# Patient Record
Sex: Female | Born: 1945 | Race: White | Hispanic: No | Marital: Married | Smoking: Former smoker
Health system: Southern US, Community
[De-identification: ages and names within clinical notes are randomized; demographics above are authoritative.]

## PROBLEM LIST (undated history)

## (undated) DIAGNOSIS — M1990 Unspecified osteoarthritis, unspecified site: Secondary | ICD-10-CM

## (undated) DIAGNOSIS — E7800 Pure hypercholesterolemia, unspecified: Secondary | ICD-10-CM

## (undated) DIAGNOSIS — Z5189 Encounter for other specified aftercare: Secondary | ICD-10-CM

## (undated) DIAGNOSIS — Z9889 Other specified postprocedural states: Secondary | ICD-10-CM

## (undated) DIAGNOSIS — J302 Other seasonal allergic rhinitis: Secondary | ICD-10-CM

## (undated) DIAGNOSIS — R112 Nausea with vomiting, unspecified: Secondary | ICD-10-CM

## (undated) HISTORY — PX: MOUTH SURGERY: SHX715

## (undated) HISTORY — PX: APPENDECTOMY: SHX54

## (undated) HISTORY — PX: TONSILLECTOMY: SUR1361

---
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
## 1991-12-21 HISTORY — PX: LAPAROTOMY: SHX154

## 1998-12-03 ENCOUNTER — Other Ambulatory Visit: Admission: RE | Admit: 1998-12-03 | Discharge: 1998-12-03 | Payer: Self-pay | Admitting: Gynecology

## 2000-01-26 ENCOUNTER — Other Ambulatory Visit: Admission: RE | Admit: 2000-01-26 | Discharge: 2000-01-26 | Payer: Self-pay | Admitting: Gynecology

## 2000-02-09 ENCOUNTER — Encounter (INDEPENDENT_AMBULATORY_CARE_PROVIDER_SITE_OTHER): Payer: Self-pay | Admitting: Specialist

## 2000-02-09 ENCOUNTER — Other Ambulatory Visit: Admission: RE | Admit: 2000-02-09 | Discharge: 2000-02-09 | Payer: Self-pay | Admitting: Gynecology

## 2001-12-05 ENCOUNTER — Other Ambulatory Visit: Admission: RE | Admit: 2001-12-05 | Discharge: 2001-12-05 | Payer: Self-pay | Admitting: Gynecology

## 2002-08-24 ENCOUNTER — Encounter: Payer: Self-pay | Admitting: *Deleted

## 2002-08-24 ENCOUNTER — Encounter: Admission: RE | Admit: 2002-08-24 | Discharge: 2002-08-24 | Payer: Self-pay | Admitting: *Deleted

## 2002-09-19 ENCOUNTER — Ambulatory Visit (HOSPITAL_COMMUNITY): Admission: RE | Admit: 2002-09-19 | Discharge: 2002-09-19 | Payer: Self-pay | Admitting: Gastroenterology

## 2002-09-19 ENCOUNTER — Encounter (INDEPENDENT_AMBULATORY_CARE_PROVIDER_SITE_OTHER): Payer: Self-pay | Admitting: Specialist

## 2003-01-24 ENCOUNTER — Other Ambulatory Visit: Admission: RE | Admit: 2003-01-24 | Discharge: 2003-01-24 | Payer: Self-pay | Admitting: Gynecology

## 2004-02-10 ENCOUNTER — Other Ambulatory Visit: Admission: RE | Admit: 2004-02-10 | Discharge: 2004-02-10 | Payer: Self-pay | Admitting: Gynecology

## 2005-03-04 ENCOUNTER — Other Ambulatory Visit: Admission: RE | Admit: 2005-03-04 | Discharge: 2005-03-04 | Payer: Self-pay | Admitting: Gynecology

## 2006-04-20 ENCOUNTER — Other Ambulatory Visit: Admission: RE | Admit: 2006-04-20 | Discharge: 2006-04-20 | Payer: Self-pay | Admitting: Gynecology

## 2007-05-09 ENCOUNTER — Other Ambulatory Visit: Admission: RE | Admit: 2007-05-09 | Discharge: 2007-05-09 | Payer: Self-pay | Admitting: Gynecology

## 2007-05-14 ENCOUNTER — Encounter: Admission: RE | Admit: 2007-05-14 | Discharge: 2007-05-14 | Payer: Self-pay | Admitting: Internal Medicine

## 2008-06-19 ENCOUNTER — Other Ambulatory Visit: Admission: RE | Admit: 2008-06-19 | Discharge: 2008-06-19 | Payer: Self-pay | Admitting: Gynecology

## 2011-05-07 NOTE — Op Note (Signed)
   [name], Leslie Rich                     ACCOUNT NO.:  [id number]   MEDICAL RECORD NO.:  [id number]                   PATIENT TYPE:  AMB   LOCATION:  ENDO                                 FACILITY:  [facility]   PHYSICIAN:  Florencia Reasons, M.D.             DATE OF BIRTH:  1946-04-05   DATE OF PROCEDURE:  09/19/2002  DATE OF DISCHARGE:                                 OPERATIVE REPORT   PROCEDURE:  Colonoscopy.   INDICATIONS FOR PROCEDURE:  This 65 year old female with transient Hemoccult  positive stool and unimpressive upper endoscopy.   FINDINGS:  Normal colonoscopy.   PROCEDURE:  The nature, purpose, and risks of the procedure have been  discussed with the patient, who provided written consent.   SEDATION:  For this procedure and the upper endoscopy which proceeded it  totalled Fentanyl 100 mcg and Versed 10 mg without arrhythmias or  desaturation.   PROCEDURE:  The Olympus adjustable tension pediatric video colonoscope was  advanced with some looping to the cecum, as identified by clear  visualization of the appendiceal orifice.  The quality of the prep was  excellent and it was felt that all areas were well seen.  There was quite a  bit of looping during advancement of the scope, suggesting that use of an  adult colonoscope might be more appropriate on future exams.   This was a normal examination.  No polyps, cancer, colitis, vascular  malformations, or diverticulosis were noted.  There might have been some  prediverticular change in the sigmoid region.  Retroflexion in the rectum as  well as reinspection of the rectum and distal sigmoid was unremarkable.  There was a fair amount of muscular thickening in the sigmoid region.   No biopsies were obtained.  The patient tolerated the procedure well and  there were no apparent complications.    IMPRESSION:  Normal colonoscopy.  No source of heme-positivity identified.   PLAN:  Flexible sigmoidoscopy in five years with  consideration for repeat  colonoscopy in 10 years.                                                Florencia Reasons, M.D.    RVB/MEDQ  D:  09/19/2002  T:  09/19/2002  Job:  [id number]   cc:   Marcene Duos, M.D.  [address]  [place]  [place] [address]  Fax: [phone]

## 2011-05-07 NOTE — Op Note (Signed)
Leslie Rich, Leslie Rich                     ACCOUNT NO.:  [id number]   MEDICAL RECORD NO.:  [id number]                   PATIENT TYPE:  AMB   LOCATION:  ENDO                                 FACILITY:  [facility]   PHYSICIAN:  Florencia Reasons, M.D.             DATE OF BIRTH:  1946/05/08   DATE OF PROCEDURE:  09/19/2002  DATE OF DISCHARGE:                                 OPERATIVE REPORT   PROCEDURE:  Upper endoscopy with biopsy.   INDICATION:  A 65 year old female with hemoccult positive stool while on  aspirin products.  She has no significant GI tract symptoms other than  occasional diet-related heartburn.  She was hemoccult negative when I  checked her in the office.   FINDINGS:  Small islands of Barrett's-appearing mucosa in the distal  esophagus.  Minimal antral gastritis.   DESCRIPTION OF PROCEDURE:  The nature, purpose, and risks of the procedure  have been discussed with the patient, who provided written consent.  Sedation for this procedure was fentanyl 25 mcg and Versed 4 mg IV without  arrhythmias or desaturation.  The Olympus video endoscope was passed under  direct vision.  The vocal cords looked normal.  The esophagus was readily  entered.  The distal esophagus had some small (2 x 5mm) patches of Barrett's-  appearing mucosa without any reflux esophagitis, free reflux, rings,  strictures or hiatal hernia appreciated.  A couple of biopsies were obtained  from the distal esophagus prior to removal of the scope.  There was no  evidence of varices, infection or neoplasia.   The stomach contained a small clear residual, which was suctioned up.  In  the antrum, were a couple of focal areas of mucosal erythema consistent with  aspirin-induced gastropathy of minimal severity.  No erosive changes or  frank mucosal hemorrhages were noted.  No polyps, masses or ulcers were seen  and the retroflexed view of the proximal stomach was unremarkable.  The  pylorus, duodenal bulb,  and second duodenum also looked normal.   The patient tolerated the procedure well and there were no apparent  complications.    IMPRESSION:  Small islands of Barrett's-appearing mucosa in the distal  esophagus of doubtful clinical significance.  Minimal antral gastritis,  probably from aspirin exposure, which, in the more severe state, might  conceivably have come from the patient's transiently hemoccult positive  stool.   PLAN:  Proceed to colonoscopic evaluation.                                                  Florencia Reasons, M.D.    RVB/MEDQ  D:  09/19/2002  T:  09/19/2002  Job:  [id number]   cc:   Marcene Duos, M.D.  [address]  [place]  [place] [address]  Fax: [phone]

## 2011-11-02 NOTE — Patient Instructions (Addendum)
   Your procedure is scheduled on: Wednesday November 28th  Enter through the [employer] [facility] at:6am Pick up the phone at the desk and dial [phone] and inform us of your arrival.  Please call this number if you have any problems the morning of surgery: [phone]  Remember: Do not eat food after midnight:Tuesday Do not drink clear liquids after:Tuesday Take these medicines the morning of surgery with a SIP OF WATER:none  Do not wear jewelry, make-up, or FINGER nail polish Do not wear lotions, powders, or perfumes.  You may not  wear deodorant. Do not shave 48 hours prior to surgery. Do not bring valuables to the hospital.  Leave suitcase in the car. After Surgery it may be brought to your room. For patients being admitted to the hospital, checkout time is 11:00am the day of discharge.      Remember to use your hibiclens as instructed.Please shower with 1/2 bottle the evening before your surgery and the other 1/2 bottle the morning of surgery.

## 2011-11-05 ENCOUNTER — Encounter (HOSPITAL_COMMUNITY): Payer: Self-pay

## 2011-11-05 ENCOUNTER — Encounter (HOSPITAL_COMMUNITY)
Admission: RE | Admit: 2011-11-05 | Discharge: 2011-11-05 | Disposition: A | Discharge status: Home / Self Care | Payer: 59 | Source: Ambulatory Visit | Attending: Obstetrics and Gynecology | Admitting: Obstetrics and Gynecology

## 2011-11-05 ENCOUNTER — Other Ambulatory Visit: Payer: Self-pay

## 2011-11-05 HISTORY — DX: Nausea with vomiting, unspecified: R11.2

## 2011-11-05 HISTORY — DX: Other specified postprocedural states: Z98.890

## 2011-11-05 HISTORY — DX: Unspecified osteoarthritis, unspecified site: M19.90

## 2011-11-05 HISTORY — DX: Encounter for other specified aftercare: Z51.89

## 2011-11-05 HISTORY — DX: Other seasonal allergic rhinitis: J30.2

## 2011-11-05 HISTORY — DX: Pure hypercholesterolemia, unspecified: E78.00

## 2011-11-05 LAB — SURGICAL PCR SCREEN
MRSA, PCR: NEGATIVE
Staphylococcus aureus: NEGATIVE

## 2011-11-05 LAB — CBC
HCT: 38.9 % (ref 36.0–46.0)
RDW: 12.8 % (ref 11.5–15.5)
WBC: 6 10*3/uL (ref 4.0–10.5)

## 2011-11-16 NOTE — H&P (Signed)
65 year old Gravida 0 presents for a TAH BSO.  She is complaining of dyspareunia which is worse with deep penetration.  She has been on Vagifem for atrophic vaginitis and the symptoms have persisted.  She also reports some chronic left lower quadrant pain.  She has had a diagnostic laparoscopy with laser vaporization of endometriosis and hysteroscopy and resection of an endometrial polyp in 2009. She also has a history of exploratory laparotomy and bilateral ovarian cystectomy back in 1993 and there was noted to be dense pelvic adhesions and endometriosis involving both ovaries.  She has recently noticed a lot of discomfort and pain on her left side notably which she and her husband are unable to have intercourse.  A recent ultrasound noted multiple fibroids with the largest one measuring 3 centimeters.    Medical history High cholesterol  Surgical history Above Correction of patent ductus arteriosus in 1956 Appendectomy 1962  Medications See list  Allergies NKDA  Social history Married History of tobacco use   Family history Unremarkable  Review of Systems As noted above  Afebrile Vital signs stable General alert and oriented Lung CTAB Car RRR Abdomen Soft nontender Pelvic Cervix is extremely deep in the vagina almost like it is pulled up Vagina appears normal  Uterus is enlarged It is mobile It is tender to palpation Definite tenderness along left pelvic sidewall  Impression Pelvic pain Fibroids Pelvic adhesions  Plan TAHBSO Risks have been extensively reviewed with the patient Documented discussion in her office chart.

## 2011-11-17 ENCOUNTER — Encounter (HOSPITAL_COMMUNITY): Payer: Self-pay | Admitting: Anesthesiology

## 2011-11-17 ENCOUNTER — Other Ambulatory Visit: Payer: Self-pay | Admitting: Obstetrics and Gynecology

## 2011-11-17 ENCOUNTER — Encounter (HOSPITAL_COMMUNITY)
Admission: RE | Disposition: A | Discharge status: Home / Self Care | Payer: Self-pay | Source: Ambulatory Visit | Attending: Obstetrics and Gynecology

## 2011-11-17 ENCOUNTER — Inpatient Hospital Stay (HOSPITAL_COMMUNITY): Payer: 59 | Admitting: Anesthesiology

## 2011-11-17 ENCOUNTER — Encounter (HOSPITAL_COMMUNITY): Payer: Self-pay | Admitting: *Deleted

## 2011-11-17 ENCOUNTER — Inpatient Hospital Stay (HOSPITAL_COMMUNITY)
Admission: RE | Admit: 2011-11-17 | Discharge: 2011-11-19 | DRG: 743 | Disposition: A | Discharge status: Home / Self Care | Payer: 59 | Source: Ambulatory Visit | Attending: Obstetrics and Gynecology | Admitting: Obstetrics and Gynecology

## 2011-11-17 DIAGNOSIS — N949 Unspecified condition associated with female genital organs and menstrual cycle: Secondary | ICD-10-CM | POA: Diagnosis present

## 2011-11-17 DIAGNOSIS — Z01818 Encounter for other preprocedural examination: Secondary | ICD-10-CM

## 2011-11-17 DIAGNOSIS — D250 Submucous leiomyoma of uterus: Principal | ICD-10-CM | POA: Diagnosis present

## 2011-11-17 DIAGNOSIS — D251 Intramural leiomyoma of uterus: Secondary | ICD-10-CM | POA: Diagnosis present

## 2011-11-17 DIAGNOSIS — D252 Subserosal leiomyoma of uterus: Secondary | ICD-10-CM | POA: Diagnosis present

## 2011-11-17 DIAGNOSIS — Z01812 Encounter for preprocedural laboratory examination: Secondary | ICD-10-CM

## 2011-11-17 DIAGNOSIS — N840 Polyp of corpus uteri: Secondary | ICD-10-CM | POA: Diagnosis present

## 2011-11-17 DIAGNOSIS — R1032 Left lower quadrant pain: Secondary | ICD-10-CM | POA: Diagnosis present

## 2011-11-17 DIAGNOSIS — IMO0002 Reserved for concepts with insufficient information to code with codable children: Secondary | ICD-10-CM | POA: Diagnosis present

## 2011-11-17 DIAGNOSIS — R102 Pelvic and perineal pain: Secondary | ICD-10-CM

## 2011-11-17 HISTORY — PX: ABDOMINAL HYSTERECTOMY: SHX81

## 2011-11-17 HISTORY — PX: SALPINGOOPHORECTOMY: SHX82

## 2011-11-17 SURGERY — HYSTERECTOMY, ABDOMINAL
Anesthesia: Choice

## 2011-11-17 MED ORDER — SCOPOLAMINE 1 MG/3DAYS TD PT72
MEDICATED_PATCH | TRANSDERMAL | Status: AC
  Administered 2011-11-17: 1 via TRANSDERMAL
  Filled 2011-11-17: qty 1

## 2011-11-17 MED ORDER — TEMAZEPAM 15 MG PO CAPS: 15.0000 mg | ORAL_CAPSULE | Freq: Every evening | ORAL | Status: DC | PRN

## 2011-11-17 MED ORDER — KETOROLAC TROMETHAMINE 30 MG/ML IJ SOLN: 30.0000 mg | Freq: Once | INTRAMUSCULAR | Status: DC

## 2011-11-17 MED ORDER — LIDOCAINE HCL (CARDIAC) 20 MG/ML IV SOLN
INTRAVENOUS | Status: AC
  Filled 2011-11-17: qty 5

## 2011-11-17 MED ORDER — KETOROLAC TROMETHAMINE 30 MG/ML IJ SOLN
30.0000 mg | Freq: Four times a day (QID) | INTRAMUSCULAR | Status: DC
  Administered 2011-11-17 – 2011-11-18 (×3): 30 mg via INTRAVENOUS
  Filled 2011-11-17 (×3): qty 1

## 2011-11-17 MED ORDER — MIDAZOLAM HCL 5 MG/5ML IJ SOLN
INTRAMUSCULAR | Status: DC | PRN
  Administered 2011-11-17: 2 mg via INTRAVENOUS

## 2011-11-17 MED ORDER — FLUTICASONE PROPIONATE 50 MCG/ACT NA SUSP
1.0000 | Freq: Every day | NASAL | Status: DC
  Administered 2011-11-18 – 2011-11-19 (×2): 1 via NASAL
  Filled 2011-11-17: qty 16

## 2011-11-17 MED ORDER — DIPHENHYDRAMINE HCL 50 MG/ML IJ SOLN: 12.5000 mg | Freq: Four times a day (QID) | INTRAMUSCULAR | Status: DC | PRN

## 2011-11-17 MED ORDER — ONDANSETRON HCL 4 MG/2ML IJ SOLN
4.0000 mg | Freq: Four times a day (QID) | INTRAMUSCULAR | Status: DC | PRN
  Administered 2011-11-17: 4 mg via INTRAVENOUS
  Filled 2011-11-17: qty 2

## 2011-11-17 MED ORDER — KETOROLAC TROMETHAMINE 30 MG/ML IJ SOLN
INTRAMUSCULAR | Status: AC
  Filled 2011-11-17: qty 1

## 2011-11-17 MED ORDER — ONDANSETRON HCL 4 MG/2ML IJ SOLN
INTRAMUSCULAR | Status: AC
  Filled 2011-11-17: qty 2

## 2011-11-17 MED ORDER — NEOSTIGMINE METHYLSULFATE 1 MG/ML IJ SOLN
INTRAMUSCULAR | Status: AC
  Filled 2011-11-17: qty 10

## 2011-11-17 MED ORDER — HYDROMORPHONE HCL PF 1 MG/ML IJ SOLN
INTRAMUSCULAR | Status: AC
  Administered 2011-11-17: 0.5 mg via INTRAVENOUS
  Filled 2011-11-17: qty 1

## 2011-11-17 MED ORDER — MIDAZOLAM HCL 2 MG/2ML IJ SOLN
INTRAMUSCULAR | Status: AC
  Filled 2011-11-17: qty 2

## 2011-11-17 MED ORDER — LACTATED RINGERS IV SOLN
INTRAVENOUS | Status: DC
  Administered 2011-11-17 (×2): via INTRAVENOUS
  Administered 2011-11-17: 125 mL/h via INTRAVENOUS

## 2011-11-17 MED ORDER — HYDROMORPHONE HCL PF 1 MG/ML IJ SOLN
0.2500 mg | INTRAMUSCULAR | Status: DC | PRN
  Administered 2011-11-17 (×2): 0.5 mg via INTRAVENOUS

## 2011-11-17 MED ORDER — SODIUM CHLORIDE 0.9 % IJ SOLN: 9.0000 mL | INTRAMUSCULAR | Status: DC | PRN

## 2011-11-17 MED ORDER — BISACODYL 5 MG PO TBEC
5.0000 mg | DELAYED_RELEASE_TABLET | Freq: Every day | ORAL | Status: DC | PRN
  Filled 2011-11-17: qty 1

## 2011-11-17 MED ORDER — HYDROMORPHONE 0.3 MG/ML IV SOLN
INTRAVENOUS | Status: DC
  Administered 2011-11-17: 0.6 mg via INTRAVENOUS
  Administered 2011-11-17: 1.79 mg via INTRAVENOUS
  Administered 2011-11-17: 7.5 mg via INTRAVENOUS
  Administered 2011-11-18: 0.199 mg via INTRAVENOUS
  Administered 2011-11-18: 0.2 mg via INTRAVENOUS

## 2011-11-17 MED ORDER — LIDOCAINE HCL (CARDIAC) 20 MG/ML IV SOLN
INTRAVENOUS | Status: DC | PRN
  Administered 2011-11-17: 60 mg via INTRAVENOUS

## 2011-11-17 MED ORDER — PROPOFOL 10 MG/ML IV EMUL
INTRAVENOUS | Status: AC
  Filled 2011-11-17: qty 20

## 2011-11-17 MED ORDER — NEOSTIGMINE METHYLSULFATE 1 MG/ML IJ SOLN
INTRAMUSCULAR | Status: DC | PRN
  Administered 2011-11-17: 2 mg via INTRAVENOUS

## 2011-11-17 MED ORDER — DOCUSATE SODIUM 100 MG PO CAPS
100.0000 mg | ORAL_CAPSULE | Freq: Every day | ORAL | Status: DC
  Administered 2011-11-18 – 2011-11-19 (×2): 100 mg via ORAL
  Filled 2011-11-17 (×2): qty 1

## 2011-11-17 MED ORDER — FENTANYL CITRATE 0.05 MG/ML IJ SOLN
INTRAMUSCULAR | Status: AC
  Filled 2011-11-17: qty 5

## 2011-11-17 MED ORDER — ROCURONIUM BROMIDE 100 MG/10ML IV SOLN
INTRAVENOUS | Status: DC | PRN
  Administered 2011-11-17: 20 mg via INTRAVENOUS
  Administered 2011-11-17: 10 mg via INTRAVENOUS

## 2011-11-17 MED ORDER — DEXAMETHASONE SODIUM PHOSPHATE 10 MG/ML IJ SOLN
INTRAMUSCULAR | Status: DC | PRN
  Administered 2011-11-17: 10 mg via INTRAVENOUS

## 2011-11-17 MED ORDER — IBUPROFEN 600 MG PO TABS
600.0000 mg | ORAL_TABLET | Freq: Four times a day (QID) | ORAL | Status: DC | PRN
  Administered 2011-11-19: 600 mg via ORAL
  Filled 2011-11-17 (×3): qty 1

## 2011-11-17 MED ORDER — DROPERIDOL 2.5 MG/ML IJ SOLN
INTRAMUSCULAR | Status: DC | PRN
  Administered 2011-11-17: 0.625 mg via INTRAVENOUS

## 2011-11-17 MED ORDER — METOCLOPRAMIDE HCL 5 MG/ML IJ SOLN
10.0000 mg | Freq: Four times a day (QID) | INTRAMUSCULAR | Status: DC
  Administered 2011-11-17 – 2011-11-18 (×4): 10 mg via INTRAVENOUS
  Filled 2011-11-17 (×4): qty 2

## 2011-11-17 MED ORDER — TRAMADOL HCL 50 MG PO TABS
50.0000 mg | ORAL_TABLET | Freq: Four times a day (QID) | ORAL | Status: DC | PRN
  Administered 2011-11-19: 50 mg via ORAL
  Filled 2011-11-17: qty 1

## 2011-11-17 MED ORDER — DIPHENHYDRAMINE HCL 12.5 MG/5ML PO ELIX
12.5000 mg | ORAL_SOLUTION | Freq: Four times a day (QID) | ORAL | Status: DC | PRN
  Filled 2011-11-17: qty 5

## 2011-11-17 MED ORDER — CEFAZOLIN SODIUM 1-5 GM-% IV SOLN
INTRAVENOUS | Status: AC
  Filled 2011-11-17: qty 50

## 2011-11-17 MED ORDER — GLYCOPYRROLATE 0.2 MG/ML IJ SOLN
INTRAMUSCULAR | Status: DC | PRN
  Administered 2011-11-17: 0.2 mg via INTRAVENOUS

## 2011-11-17 MED ORDER — CEFAZOLIN SODIUM 1-5 GM-% IV SOLN
1.0000 g | INTRAVENOUS | Status: AC
  Administered 2011-11-17: 1 g via INTRAVENOUS

## 2011-11-17 MED ORDER — PROMETHAZINE HCL 25 MG/ML IJ SOLN
12.5000 mg | INTRAMUSCULAR | Status: DC | PRN
  Administered 2011-11-17 (×2): 12.5 mg via INTRAVENOUS
  Filled 2011-11-17 (×2): qty 1

## 2011-11-17 MED ORDER — PROMETHAZINE HCL 25 MG/ML IJ SOLN: 6.2500 mg | Freq: Four times a day (QID) | INTRAMUSCULAR | Status: DC | PRN

## 2011-11-17 MED ORDER — NALOXONE HCL 0.4 MG/ML IJ SOLN: 0.4000 mg | INTRAMUSCULAR | Status: DC | PRN

## 2011-11-17 MED ORDER — DROPERIDOL 2.5 MG/ML IJ SOLN
INTRAMUSCULAR | Status: AC
  Filled 2011-11-17: qty 2

## 2011-11-17 MED ORDER — PROPOFOL 10 MG/ML IV EMUL
INTRAVENOUS | Status: DC | PRN
  Administered 2011-11-17: 180 mg via INTRAVENOUS

## 2011-11-17 MED ORDER — GLYCOPYRROLATE 0.2 MG/ML IJ SOLN
INTRAMUSCULAR | Status: AC
  Filled 2011-11-17: qty 1

## 2011-11-17 MED ORDER — KETOROLAC TROMETHAMINE 30 MG/ML IJ SOLN
INTRAMUSCULAR | Status: DC | PRN
  Administered 2011-11-17: 30 mg via INTRAVENOUS

## 2011-11-17 MED ORDER — DEXAMETHASONE SODIUM PHOSPHATE 10 MG/ML IJ SOLN
INTRAMUSCULAR | Status: AC
  Filled 2011-11-17: qty 1

## 2011-11-17 MED ORDER — MENTHOL 3 MG MT LOZG: 1.0000 | LOZENGE | OROMUCOSAL | Status: DC | PRN

## 2011-11-17 MED ORDER — ONDANSETRON HCL 4 MG/2ML IJ SOLN
INTRAMUSCULAR | Status: DC | PRN
  Administered 2011-11-17: 4 mg via INTRAVENOUS

## 2011-11-17 MED ORDER — DEXTROSE IN LACTATED RINGERS 5 % IV SOLN
INTRAVENOUS | Status: DC
  Administered 2011-11-17 – 2011-11-18 (×2): via INTRAVENOUS

## 2011-11-17 MED ORDER — BISACODYL 10 MG RE SUPP: 10.0000 mg | Freq: Every day | RECTAL | Status: DC | PRN

## 2011-11-17 MED ORDER — ROCURONIUM BROMIDE 50 MG/5ML IV SOLN
INTRAVENOUS | Status: AC
  Filled 2011-11-17: qty 1

## 2011-11-17 MED ORDER — FENTANYL CITRATE 0.05 MG/ML IJ SOLN
INTRAMUSCULAR | Status: DC | PRN
  Administered 2011-11-17 (×2): 100 ug via INTRAVENOUS
  Administered 2011-11-17: 50 ug via INTRAVENOUS

## 2011-11-17 SURGICAL SUPPLY — 36 items
CANISTER SUCTION 2500CC (MISCELLANEOUS) ×3 IMPLANT
CHLORAPREP W/TINT 26ML (MISCELLANEOUS) ×3 IMPLANT
CLOTH BEACON ORANGE TIMEOUT ST (SAFETY) ×3 IMPLANT
DECANTER SPIKE VIAL GLASS SM (MISCELLANEOUS) IMPLANT
DERMABOND ADVANCED (GAUZE/BANDAGES/DRESSINGS) ×1
DERMABOND ADVANCED .7 DNX12 (GAUZE/BANDAGES/DRESSINGS) ×2 IMPLANT
DRAPE UTILITY XL STRL (DRAPES) ×3 IMPLANT
DRSG COVADERM 4X10 (GAUZE/BANDAGES/DRESSINGS) ×3 IMPLANT
GAUZE SPONGE 4X4 16PLY XRAY LF (GAUZE/BANDAGES/DRESSINGS) IMPLANT
GLOVE BIO SURGEON STRL SZ 6.5 (GLOVE) ×6 IMPLANT
GOWN PREVENTION PLUS LG XLONG (DISPOSABLE) ×9 IMPLANT
LIGASURE IMPACT 36 18CM CVD LR (INSTRUMENTS) IMPLANT
NEEDLE HYPO 22GX1.5 SAFETY (NEEDLE) ×3 IMPLANT
NEEDLE HYPO 25X1 1.5 SAFETY (NEEDLE) IMPLANT
NS IRRIG 1000ML POUR BTL (IV SOLUTION) ×3 IMPLANT
PACK ABDOMINAL GYN (CUSTOM PROCEDURE TRAY) ×3 IMPLANT
PAD OB MATERNITY 4.3X12.25 (PERSONAL CARE ITEMS) ×3 IMPLANT
SEPRAFILM MEMBRANE 5X6 (MISCELLANEOUS) IMPLANT
SPONGE LAP 18X18 X RAY DECT (DISPOSABLE) ×6 IMPLANT
STAPLER VISISTAT 35W (STAPLE) IMPLANT
SUT PDS AB 0 CT 36 (SUTURE) IMPLANT
SUT PDS AB 0 CTX 60 (SUTURE) IMPLANT
SUT PLAIN 2 0 XLH (SUTURE) IMPLANT
SUT VIC AB 0 CT1 18XCR BRD8 (SUTURE) ×4 IMPLANT
SUT VIC AB 0 CT1 27 (SUTURE) ×4
SUT VIC AB 0 CT1 27XBRD ANBCTR (SUTURE) ×8 IMPLANT
SUT VIC AB 0 CT1 8-18 (SUTURE) ×2
SUT VIC AB 3-0 PS1 18 (SUTURE)
SUT VIC AB 3-0 PS1 18X BRD (SUTURE) IMPLANT
SUT VIC AB 4-0 KS 27 (SUTURE) ×3 IMPLANT
SUT VICRYL 0 TIES 12 18 (SUTURE) ×3 IMPLANT
SYR CONTROL 10ML LL (SYRINGE) IMPLANT
SYRINGE 10CC LL (SYRINGE) ×3 IMPLANT
TOWEL OR 17X24 6PK STRL BLUE (TOWEL DISPOSABLE) ×6 IMPLANT
TRAY FOLEY CATH 14FR (SET/KITS/TRAYS/PACK) ×3 IMPLANT
WATER STERILE IRR 1000ML POUR (IV SOLUTION) ×3 IMPLANT

## 2011-11-17 NOTE — Op Note (Signed)
NAMEANNYE, FORREY NO.:  [id number]  MEDICAL RECORD NO.:  [id number]  LOCATION:  [facility]                          FACILITY:  [facility]  PHYSICIAN:  Dalayna Lauter L. Tynlee Bayle, M.D.DATE OF BIRTH:  [birth year]  DATE OF PROCEDURE:  11/17/2011 DATE OF DISCHARGE:                              OPERATIVE REPORT   PREOPERATIVE DIAGNOSES:  Pelvic pain, fibroids, and pelvic adhesions.  POSTOPERATIVE DIAGNOSES:  Pelvic pain, fibroids, and pelvic adhesions.  PROCEDURE:  Total abdominal hysterectomy and bilateral salpingo- oophorectomy.  SURGEON:  Grantland Want L. Vincente Poli, MD  ASSISTANT:  Freddy Finner, MD  ANESTHESIA:  General.  ESTIMATED BLOOD LOSS:  Minimal.  COMPLICATIONS:  None.  DRAINS:  Foley.  PATHOLOGY:  Uterus, tubes, cervix, and ovaries sent to pathology.  PROCEDURE:  The patient was taken to the operating room.  She was intubated.  She was prepped and draped in usual sterile fashion.  Foley catheter was inserted.  A low transverse incision was made, carried down to the fascia.  Fascia was scored in the midline, extended laterally. Rectus muscles were separated in the midline.  Peritoneum was entered bluntly.  The large and small bowel packed in the upper abdomen using a self-retaining retractor.  Exam of the pelvis revealed the following. The uterus had multiple fibroids.  It was very small.  The right tube and ovary were normal.  There was a large band of omental adhesions to the fundus and this was released easily.  There were numerous adhesions involving the colon to the left ovary.  We took those down easily using sharp dissection.  I think this was the cause of the patient's chronic left lower quadrant pain.  No evidence of endometriosis was noted.  We used Kelly clamps and elevated the uterus and then identified the round ligament on the right side and we placed a stitch across the round ligament.  We then incised the broad ligament on either side.  An avascular  window was developed just beneath the infundibular pelvic ligament with careful attention to avoid the ureter and a curved Heaney clamp was placed across the IP ligament.  The pedicle was cut and suture ligated using 0 Vicryl suture and then further secured using a free tie of 0 Vicryl suture.  We also did this on the patient's left side in identical fashion.  We then skeletonized the uterine artery, clamped the uterine artery at the level of the internal os using curved Heaney clamps.  Each pedicle was clamped, cut, and suture ligated using 0 Vicryl suture.  We then developed the bladder flap anteriorly and then placed a straight Heaney clamp just beside the uterus and the cervix clamping the uterosacral cardinal ligaments.  Each pedicle was clamped, cut, and suture ligated using 0 Vicryl suture.  We then placed curved Heaney clamps just beneath the cervix.  Each pedicle was then secured using an angle stitch using 0 Vicryl suture.  The specimen had been removed and was identified as cervix, uterus, tubes, and ovaries. Remainder of the vaginal cuff was closed using 2 figure-of-eight using 0 Vicryl suture.  Irrigation was performed.  Hemostasis was excellent. All sponges and instruments removed from the abdominal cavity.  The peritoneum  was closed using 0 Vicryl running stitch and the fascia closed using 0 Vicryl running stitch x2 starting each corner, meeting in the midline.  After irrigation of subcutaneous layer, the skin was closed with staples.  All sponge, lap, and instrument counts were correct x2.  The patient went to recovery room in stable condition.     Analee Montee L. Vincente Poli, M.D.     Florestine Avers  D:  11/17/2011  T:  11/17/2011  Job:  [id number]

## 2011-11-17 NOTE — Anesthesia Preprocedure Evaluation (Signed)
Anesthesia Evaluation  Patient identified by MRN, date of birth, ID band Patient awake    Reviewed: Allergy & Precautions, H&P , Patient's Chart, lab work & pertinent test results, reviewed documented beta blocker date and time   Airway Mallampati: II TM Distance: >3 FB Neck ROM: full    Dental No notable dental hx. (+)    Pulmonary  clear to auscultation  Pulmonary exam normal       Cardiovascular regular Normal    Neuro/Psych    GI/Hepatic   Endo/Other    Renal/GU      Musculoskeletal   Abdominal   Peds  Hematology   Anesthesia Other Findings   Reproductive/Obstetrics                          Anesthesia Physical Anesthesia Plan  ASA: II  Anesthesia Plan: General   Post-op Pain Management:    Induction: Intravenous  Airway Management Planned: Oral ETT  Additional Equipment:   Intra-op Plan:   Post-operative Plan:   Informed Consent: I have reviewed the patients History and Physical, chart, labs and discussed the procedure including the risks, benefits and alternatives for the proposed anesthesia with the patient or authorized representative who has indicated his/her understanding and acceptance.   Dental Advisory Given  Plan Discussed with: CRNA and Surgeon  Anesthesia Plan Comments: (  Discussed  general anesthesia, including possible nausea, instrumentation of airway, sore throat,pulmonary aspiration, etc. I asked if the were any outstanding questions, or  concerns before we proceeded. )        Anesthesia Quick Evaluation  

## 2011-11-17 NOTE — Anesthesia Postprocedure Evaluation (Signed)
  Anesthesia Post-op Note  Patient: Leslie Rich  Procedure(s) Performed:  HYSTERECTOMY ABDOMINAL; SALPINGO OOPHERECTOMY  Patient Location: [facility] Unit  Anesthesia Type: General  Level of Consciousness: awake, alert  and oriented  Airway and Oxygen Therapy: Patient connected to nasal cannula oxygen  Post-op Assessment: Patient's Cardiovascular Status Stable and Respiratory Function Stable  Post-op Vital Signs: Reviewed and stable  Complications: No apparent anesthesia complications Tx for N/V

## 2011-11-17 NOTE — Anesthesia Postprocedure Evaluation (Signed)
Anesthesia Post Note  Patient: Leslie Rich  Procedure(s) Performed:  HYSTERECTOMY ABDOMINAL; SALPINGO OOPHERECTOMY  Anesthesia type: GA  Patient location: PACU  Post pain: Pain level controlled  Post assessment: Post-op Vital signs reviewed  Last Vitals:  Filed Vitals:   11/17/11 0838  BP: 131/67  Pulse: 75  Temp: 36.8 C  Resp: 12    Post vital signs: Reviewed  Level of consciousness: sedated  Complications: No apparent anesthesia complications

## 2011-11-17 NOTE — Progress Notes (Signed)
History and Physical on the chart. No changes in physical exam. Will proceed with TAHBSO Risks discussed with patient

## 2011-11-17 NOTE — Addendum Note (Signed)
Addendum  created 11/17/11 1835 by Edison Pace, CRNA   Modules edited:Notes Section

## 2011-11-17 NOTE — Brief Op Note (Signed)
11/17/2011  8:50 AM  PATIENT:  Leslie Rich  65 y.o. female  PRE-OPERATIVE DIAGNOSIS:  fibroids, dysparuenia  POST-OPERATIVE DIAGNOSIS:  fibroids, dysparuenia, pelvic adhesions  PROCEDURE:  Procedure(s): HYSTERECTOMY ABDOMINAL BILATERAL SALPINGO OOPHERECTOMY  SURGEON:  Surgeon(s): Jeani Hawking, MD  PHYSICIAN ASSISTANT:   ASSISTANTS: Dr. Jennette Kettle   ANESTHESIA:   general  EBL:  Total I/O In: 1400 [I.V.:1400] Out: 175 [Urine:150; Blood:25]  BLOOD ADMINISTERED:none  DRAINS: Urinary Catheter (Foley)   LOCAL MEDICATIONS USED:  NONE  SPECIMEN:  Source of Specimen:  uterus, tubes, cervix, ovaries  DISPOSITION OF SPECIMEN:  PATHOLOGY  COUNTS:  YES  TOURNIQUET:  * No tourniquets in log *  DICTATION: .Other Dictation: Dictation Number [id number]  PLAN OF CARE: Admit to inpatient   PATIENT DISPOSITION:  PACU - hemodynamically stable.   Delay start of Pharmacological VTE agent (>24hrs) due to surgical blood loss or risk of bleeding:  {YES/NO/NOT APPLICABLE:20182

## 2011-11-17 NOTE — Transfer of Care (Signed)
Immediate Anesthesia Transfer of Care Note  Patient: Leslie Rich  Procedure(s) Performed:  HYSTERECTOMY ABDOMINAL; SALPINGO OOPHERECTOMY  Patient Location: PACU  Anesthesia Type: General  Level of Consciousness: awake  Airway & Oxygen Therapy: Patient Spontanous Breathing and Patient connected to nasal cannula oxygen  Post-op Assessment: Report given to PACU RN and Post -op Vital signs reviewed and stable  Post vital signs: Reviewed and stable  Complications: No apparent anesthesia complications

## 2011-11-18 ENCOUNTER — Encounter (HOSPITAL_COMMUNITY): Payer: Self-pay | Admitting: Obstetrics and Gynecology

## 2011-11-18 LAB — CBC
HCT: 32 % — ABNORMAL LOW (ref 36.0–46.0)
MCV: 90.7 fL (ref 78.0–100.0)
Platelets: 183 10*3/uL (ref 150–400)
RBC: 3.53 MIL/uL — ABNORMAL LOW (ref 3.87–5.11)
WBC: 8.5 10*3/uL (ref 4.0–10.5)

## 2011-11-18 MED ORDER — IBUPROFEN 600 MG PO TABS
600.0000 mg | ORAL_TABLET | Freq: Four times a day (QID) | ORAL | Status: DC | PRN
  Administered 2011-11-18 (×2): 600 mg via ORAL

## 2011-11-18 MED ORDER — HYDROCODONE-ACETAMINOPHEN 5-325 MG PO TABS
1.0000 | ORAL_TABLET | ORAL | Status: DC | PRN
  Administered 2011-11-18: 1 via ORAL
  Filled 2011-11-18: qty 1

## 2011-11-18 NOTE — Progress Notes (Signed)
1 Day Post-Op Procedure(s): HYSTERECTOMY ABDOMINAL SALPINGO OOPHERECTOMY  Subjective: Patient reports incisional pain, tolerating PO and + flatus.  Eating a regular breakfast.  Objective: I have reviewed patient's vital signs, intake and output, medications and labs.  General: alert and cooperative Resp: clear to auscultation bilaterally Cardio: regular rate and rhythm, S1, S2 normal, no murmur, click, rub or gallop Abdomen is soft and non tender  Bandage is clean and dry   Assessment: s/p Procedure(s): HYSTERECTOMY ABDOMINAL SALPINGO OOPHERECTOMY: stable, progressing well and tolerating diet  Plan: Advance diet Encourage ambulation Advance to PO medication Discontinue IV fluids  LOS: 1 day    Emaad Nanna L 11/18/2011, 8:46 AM

## 2011-11-18 NOTE — Progress Notes (Signed)
UR Chart review completed.  

## 2011-11-19 MED ORDER — ACETAMINOPHEN 325 MG PO TABS
650.0000 mg | ORAL_TABLET | ORAL | Status: DC | PRN
  Administered 2011-11-19: 650 mg via ORAL
  Filled 2011-11-19: qty 2

## 2011-11-19 MED ORDER — IBUPROFEN 600 MG PO TABS: 600.0000 mg | ORAL_TABLET | Freq: Four times a day (QID) | ORAL | Status: AC | PRN

## 2011-11-19 MED ORDER — PROMETHAZINE HCL 25 MG PO TABS
12.5000 mg | ORAL_TABLET | Freq: Four times a day (QID) | ORAL | Status: DC | PRN
  Administered 2011-11-19: 12.5 mg via ORAL
  Filled 2011-11-19: qty 1

## 2011-11-19 MED ORDER — HYDROCODONE-ACETAMINOPHEN 5-325 MG PO TABS: 1.0000 | ORAL_TABLET | ORAL | Status: AC | PRN

## 2011-11-19 NOTE — Progress Notes (Signed)
2 Days Post-Op Procedure(s): HYSTERECTOMY ABDOMINAL SALPINGO OOPHERECTOMY  Subjective: Patient reports nausea, tolerating PO, + flatus, + BM and no problems voiding. Some mild nausea Pain is very minimal    Objective: I have reviewed patient's vital signs, intake and output, medications and labs.  General: alert, cooperative and appears stated age Resp: clear to auscultation bilaterally Cardio: regular rate and rhythm, S1, S2 normal, no murmur, click, rub or gallop Abdomen is soft and nontender Incision is clean, dry and intact Assessment: s/p Procedure(s): HYSTERECTOMY ABDOMINAL SALPINGO OOPHERECTOMY: stable, progressing well and tolerating diet  Plan: Discharge home Followup on Tuesday to remove staples Rx Ibuprofen and Vicodin  LOS: 2 days    Camilla Skeen L 11/19/2011, 8:38 AM

## 2011-11-19 NOTE — Discharge Summary (Signed)
Admission Diagnosis: Chronic LLQ Pain Fibroids  Discharge Diagnosis: Same Pelvic Adhesions  Hospital Course: 65 year old female admitted and underwent TAH/BSO and lysis of adhesions. She did very well post op. She was ambulating had good pain control and voiding well post op. By post op day 2 she was in excellent condition.  She was discharged home in good condition on POD number 2 and will be sent home with Ibuprofen and Vicodin to use as needed for pain. She will followup next Tuesday in my office to have her staples removed. She was advised no driving for 2 weeks No intercourse for 6 weeks

## 2011-11-19 NOTE — Progress Notes (Signed)
Patient c/o mild headache and "neck tension". Given Tramadol, and warm pack to neck.

## 2011-11-19 NOTE — Progress Notes (Signed)
UR Chart review completed.  

## 2011-11-19 NOTE — Progress Notes (Signed)
Continues to c/o neck discomfort, wanting tylenol. Physician called and tylenol 650 given.

## 2012-09-11 ENCOUNTER — Other Ambulatory Visit: Payer: Self-pay | Admitting: Obstetrics and Gynecology

## 2013-06-07 ENCOUNTER — Ambulatory Visit (INDEPENDENT_AMBULATORY_CARE_PROVIDER_SITE_OTHER): Payer: BC Managed Care – PPO | Admitting: Family Medicine

## 2013-06-07 VITALS — BP 122/79 | HR 63 | Temp 98.0°F | Resp 16 | Ht 66.0 in | Wt 192.0 lb

## 2013-06-07 DIAGNOSIS — M461 Sacroiliitis, not elsewhere classified: Secondary | ICD-10-CM

## 2013-06-07 MED ORDER — PREDNISONE 20 MG PO TABS: ORAL_TABLET | ORAL | Status: DC

## 2013-06-07 NOTE — Patient Instructions (Signed)
Sacroiliac Joint Dysfunction  The sacroiliac joint connects the lower part of the spine (the sacrum) with the bones of the pelvis.  CAUSES   Sometimes, there is no obvious reason for sacroiliac joint dysfunction. Other times, it may occur    During pregnancy.   After injury, such as:   Car accidents.   Sport-related injuries.   Work-related injuries.   Due to one leg being shorter than the other.   Due to other conditions that affect the joints, such as:   Rheumatoid arthritis.   Gout.   Psoriasis.   Joint infection (septic arthritis).  SYMPTOMS   Symptoms may include:   Pain in the:   Lower back.   Buttocks.   Groin.   Thighs and legs.   Difficult sitting, standing, walking, lying, bending or lifting.  DIAGNOSIS   A number of tests may be used to help diagnose the cause of sacroiliac joint dysfunction, including:   Imaging tests to look for other causes of pain, including:   MRI.   CT scan.   Bone scan.   Diagnostic injection: During a special x-ray (called fluoroscopy), a needle is put into the sacroiliac joint. A numbing medicine is injected into the joint. If the pain is improved or stopped, the diagnosis of sacroiliac joint dysfunction is more likely.  TREATMENT   There are a number of types of treatment used for sacroiliac joint dysfunction, including:   Only take over-the-counter or prescription medicines for pain, discomfort, or fever as directed by your caregiver.   Medications to relax muscles.   Rest. Decreasing activity can help cut down on painful muscle spasms and allow the back to heal.   Application of heat or ice to the lower back may improve muscle spasms and soothe pain.   Brace. A special back brace, called a sacroiliac belt, can help support the joint while your back is healing.   Physical therapy can help teach comfortable positions and exercises to strengthen muscles that support the sacroiliac joint.   Cortisone injections. Injections of steroid medicine into the  joint can help decrease swelling and improve pain.   Hyaluronic acid injections. This chemical improves lubrication within the sacroiliac joint, thereby decreasing pain.   Radiofrequency ablation. A special needle is placed into the joint, where it burns away nerves that are carrying pain messages from the joint.   Surgery. Because pain occurs during movement of the joint, screws and plates may be installed in order to limit or prevent joint motion.  HOME CARE INSTRUCTIONS    Take all medications exactly as directed.   Follow instructions regarding both rest and physical activity, to avoid worsening the pain.   Do physical therapy exercises exactly as prescribed.  SEEK IMMEDIATE MEDICAL CARE IF:   You experience increasingly severe pain.   You develop new symptoms, such as numbness or tingling in your legs or feet.   You lose bladder or bowel control.  Document Released: 03/04/2009 Document Revised: 02/28/2012 Document Reviewed: 03/04/2009  [employer] Patient Information 2014 [employer].

## 2013-06-07 NOTE — Progress Notes (Signed)
67 yo woman with back pain.  Went to graduation of daughter on Friday, "corn hole" game on Saturday, some yard work on Sunday, awoke with pain in left gluteal area and radiating down left leg posteriorly.  Progressive pain despite the ibuprofen taken.  Daughter is going to [employer] college orientation in a couple days. F/H:  Positive  For sciatica(mother), positive for lupus and rheumatoid arthritis(mom)  Husband dealing with  Recurrence of kidney cancer with Darvin Neighbours, MD   Objective:  NAD Able to pull left knee to chest. Positive knee crossover bilateral Negative SLR Reflexes:  No asymmetry, decreased bilaterally Sensory check: normal Abdomen:  Negative  Assessment:  sacroileitis  Plan:  Prednisone 20 mg  Ii daily  Signed, Elvina Sidle, MD

## 2013-09-18 ENCOUNTER — Other Ambulatory Visit: Payer: Self-pay | Admitting: Obstetrics and Gynecology

## 2013-11-07 ENCOUNTER — Ambulatory Visit (INDEPENDENT_AMBULATORY_CARE_PROVIDER_SITE_OTHER): Payer: Medicare Other

## 2013-11-07 DIAGNOSIS — Z23 Encounter for immunization: Secondary | ICD-10-CM

## 2013-12-28 ENCOUNTER — Encounter (HOSPITAL_COMMUNITY): Payer: Self-pay | Admitting: Emergency Medicine

## 2013-12-28 ENCOUNTER — Other Ambulatory Visit: Payer: Self-pay

## 2013-12-28 ENCOUNTER — Emergency Department (HOSPITAL_COMMUNITY)
Admission: EM | Admit: 2013-12-28 | Discharge: 2013-12-29 | Disposition: A | Discharge status: Home / Self Care | Payer: Medicare Other | Attending: Emergency Medicine | Admitting: Emergency Medicine

## 2013-12-28 DIAGNOSIS — Z87891 Personal history of nicotine dependence: Secondary | ICD-10-CM | POA: Insufficient documentation

## 2013-12-28 DIAGNOSIS — M129 Arthropathy, unspecified: Secondary | ICD-10-CM | POA: Insufficient documentation

## 2013-12-28 DIAGNOSIS — F430 Acute stress reaction: Secondary | ICD-10-CM | POA: Insufficient documentation

## 2013-12-28 DIAGNOSIS — M546 Pain in thoracic spine: Secondary | ICD-10-CM | POA: Insufficient documentation

## 2013-12-28 DIAGNOSIS — R079 Chest pain, unspecified: Secondary | ICD-10-CM | POA: Insufficient documentation

## 2013-12-28 DIAGNOSIS — M25519 Pain in unspecified shoulder: Secondary | ICD-10-CM | POA: Insufficient documentation

## 2013-12-28 DIAGNOSIS — E7800 Pure hypercholesterolemia, unspecified: Secondary | ICD-10-CM | POA: Insufficient documentation

## 2013-12-28 DIAGNOSIS — Z5189 Encounter for other specified aftercare: Secondary | ICD-10-CM | POA: Insufficient documentation

## 2013-12-28 DIAGNOSIS — J309 Allergic rhinitis, unspecified: Secondary | ICD-10-CM | POA: Insufficient documentation

## 2013-12-28 DIAGNOSIS — Z79899 Other long term (current) drug therapy: Secondary | ICD-10-CM | POA: Insufficient documentation

## 2013-12-28 LAB — CBC
HCT: 39.7 % (ref 36.0–46.0)
HEMOGLOBIN: 13.5 g/dL (ref 12.0–15.0)
MCH: 29.9 pg (ref 26.0–34.0)
MCHC: 34 g/dL (ref 30.0–36.0)
MCV: 87.8 fL (ref 78.0–100.0)
Platelets: 249 10*3/uL (ref 150–400)
RBC: 4.52 MIL/uL (ref 3.87–5.11)
RDW: 13.2 % (ref 11.5–15.5)
WBC: 7.6 10*3/uL (ref 4.0–10.5)

## 2013-12-28 LAB — POCT I-STAT TROPONIN I: Troponin i, poc: 0.05 ng/mL (ref 0.00–0.08)

## 2013-12-28 NOTE — ED Notes (Addendum)
Tonite: felt tightness over back left shoulder - spasms. Stayed and didn't go away.  Took 325 mg asa.  No hx. Of high bp. Husband had cp x 2 weeks ago. Rates cp 2-3/10. Not clammy, no n/v. Under much stress.Leslie Rich

## 2013-12-29 ENCOUNTER — Emergency Department (HOSPITAL_COMMUNITY): Payer: Medicare Other

## 2013-12-29 LAB — BASIC METABOLIC PANEL
BUN: 15 mg/dL (ref 6–23)
CHLORIDE: 102 meq/L (ref 96–112)
CO2: 26 mEq/L (ref 19–32)
Calcium: 9.8 mg/dL (ref 8.4–10.5)
Creatinine, Ser: 0.72 mg/dL (ref 0.50–1.10)
GFR, EST NON AFRICAN AMERICAN: 88 mL/min — AB (ref >90)
GLUCOSE: 119 mg/dL — AB (ref 70–99)
POTASSIUM: 3.7 meq/L (ref 3.7–5.3)
SODIUM: 138 meq/L (ref 137–147)

## 2013-12-29 LAB — POCT I-STAT TROPONIN I: Troponin i, poc: 0 ng/mL (ref 0.00–0.08)

## 2013-12-29 NOTE — ED Provider Notes (Signed)
CSN: [id number]     Arrival date & time 12/28/13  2311 History   First MD Initiated Contact with Patient 12/29/13 0009     Chief Complaint  Patient presents with  . Chest Pain   (Consider location/radiation/quality/duration/timing/severity/associated sxs/prior Treatment) HPI Comments: Leslie Rich is a 68 y.o. female who presents for evaluation of left upper chest left shoulder and left upper back, pain. The pain started suddenly while she was looking at a picture and they spoke that startled her. This image caused her to think about her husband, who is very ill and currently being hospitalized for chemotherapy. The pain is moving around in in the areas mentioned above, and is mild in nature. There is no associated shortness of breath, cough, sweating, nausea, vomiting, weakness, or dizziness. She has not had this pain previously. She feels like she is under stress due to her husband's illness. There are no other known modifying factors.   Patient is a 68 y.o. female presenting with chest pain. The history is provided by the patient.  Chest Pain   Past Medical History  Diagnosis Date  . PONV (postoperative nausea and vomiting)   . Hypercholesteremia     on crestor  . Blood transfusion     as child with heart surg-[facility]  . Arthritis   . Seasonal allergies    Past Surgical History  Procedure Laterality Date  . Patent ductus aorta correction  1957    catheterization and repair of patent ductus  . Appendectomy    . Laparotomy  1993    ovarian cystectomy  . Tonsillectomy    . Mouth surgery    . Abdominal hysterectomy  11/17/2011    Procedure: HYSTERECTOMY ABDOMINAL;  Surgeon: Cyril Mourning, MD;  Location: [facility];  Service: Gynecology;  Laterality: N/A;  . Salpingoophorectomy  11/17/2011    Procedure: SALPINGO OOPHERECTOMY;  Surgeon: Cyril Mourning, MD;  Location: [facility];  Service: Gynecology;  Laterality: Bilateral;   No family history on file. History  Substance Use  Topics  . Smoking status: Former Smoker    Quit date: 11/05/1987  . Smokeless tobacco: Not on file  . Alcohol Use: No     Comment: history of alcohol abuse-has not used since 1986   OB History   Grav Para Term Preterm Abortions TAB SAB Ect Mult Living                 Review of Systems  Cardiovascular: Positive for chest pain.  All other systems reviewed and are negative.    Allergies  Review of patient's allergies indicates no known allergies.  Home Medications   Current Outpatient Rx  Name  Route  Sig  Dispense  Refill  . 5-Hydroxytryptophan (5-HTP PO)   Oral   Take 2 tablets by mouth daily.         Marland Kitchen CALCIUM PO   Oral   Take 1 tablet by mouth daily.         . cetirizine (ZYRTEC) 10 MG tablet   Oral   Take 10 mg by mouth daily as needed. For allergies          . estradiol (VAGIFEM) 25 MCG vaginal tablet   Vaginal   Place 25 mcg vaginally 2 (two) times a week. Sunday and thursday         . fluticasone (FLONASE) 50 MCG/ACT nasal spray   Each Nare   Place 1 spray into both nostrils daily as needed for allergies or  rhinitis.         Marland Kitchen MAGNESIUM PO   Oral   Take 1 tablet by mouth daily.         . montelukast (SINGULAIR) 10 MG tablet   Oral   Take 10 mg by mouth at bedtime as needed (allergies).         . Nutritional Supplements (LITHATE PO)   Oral   Take 1 tablet by mouth daily.         Marland Kitchen POTASSIUM PO   Oral   Take 1 tablet by mouth daily.         . Red Yeast Rice 600 MG CAPS   Oral   Take 1 capsule by mouth 2 (two) times daily.          BP 110/72  Pulse 63  Temp(Src) 97.4 F (36.3 C) (Oral)  Resp 17  Ht 5\' 6"  (1.676 m)  Wt 186 lb (84.369 kg)  BMI 30.04 kg/m2  SpO2 98% Physical Exam  Nursing note and vitals reviewed. Constitutional: She is oriented to person, place, and time. She appears well-developed and well-nourished.  HENT:  Head: Normocephalic and atraumatic.  Eyes: Conjunctivae and EOM are normal. Pupils are equal,  round, and reactive to light.  Neck: Normal range of motion and phonation normal. Neck supple.  Cardiovascular: Normal rate, regular rhythm and intact distal pulses.   Pulmonary/Chest: Effort normal and breath sounds normal. She exhibits no tenderness.  Abdominal: Soft. She exhibits no distension. There is no tenderness. There is no guarding.  Musculoskeletal: Normal range of motion.  Neurological: She is alert and oriented to person, place, and time. She exhibits normal muscle tone.  Skin: Skin is warm and dry.  Psychiatric: Her behavior is normal. Judgment and thought content normal.  Mild anxiety    ED Course  Procedures (including critical care time)  Medications - No data to display  Patient Vitals for the past 24 hrs:  BP Temp Temp src Pulse Resp SpO2 Height Weight  12/29/13 0245 110/72 mmHg - - 63 17 98 % - -  12/29/13 0241 111/62 mmHg - - 63 18 99 % - -  12/29/13 0200 129/63 mmHg - - 66 17 99 % - -  12/29/13 0145 128/65 mmHg - - 63 15 98 % - -  12/29/13 0130 117/64 mmHg - - 66 13 97 % - -  12/29/13 0115 110/91 mmHg - - 65 15 98 % - -  12/29/13 0100 110/62 mmHg - - 66 16 97 % - -  12/29/13 0045 110/66 mmHg - - - 14 - - -  12/28/13 2319 118/76 mmHg 97.4 F (36.3 C) Oral 79 14 100 % - -  12/28/13 2316 - - - - - - 5\' 6"  (1.676 m) 186 lb (84.369 kg)    3:43 AM Reevaluation with update and discussion. After initial assessment and treatment, an updated evaluation reveals she is comfortable. No CP now. [name]      Date: 12/28/13 23:19  Rate: 76  Rhythm: normal sinus rhythm  QRS Axis: normal  PR and QT Intervals: normal  ST/T Wave abnormalities: normal  PR and QRS Conduction Disutrbances:none  Narrative Interpretation:   Old EKG Reviewed: unchanged    Labs Review Labs Reviewed  BASIC METABOLIC PANEL - Abnormal; Notable for the following:    Glucose, Bld 119 (*)    GFR calc non Af Amer 88 (*)    All other components within normal limits  CBC  POCT  I-STAT  TROPONIN I  POCT I-STAT TROPONIN I   Imaging Review Dg Chest Port 1 View  12/29/2013   CLINICAL DATA:  Chest pain.  EXAM: PORTABLE CHEST - 1 VIEW  COMPARISON:  None.  FINDINGS: The heart size and mediastinal contours are within normal limits. Both lungs are clear. No pneumothorax or pleural effusion is noted. The visualized skeletal structures are unremarkable.  IMPRESSION: No acute cardiopulmonary abnormality seen.   Electronically Signed   By: Sabino Dick M.D.   On: 12/29/2013 01:15    EKG Interpretation   None        MDM   1. Chest pain    Nonspecific chest pain in low risk cardiac patient. Doubt ACS, PE, or pneumonia. She is stable for discharge  Nursing Notes Reviewed/ Care Coordinated Applicable Imaging Reviewed Interpretation of Laboratory Data incorporated into ED treatment  The patient appears reasonably screened and/or stabilized for discharge and I doubt any other medical condition or other [facility] requiring further screening, evaluation, or treatment in the ED at this time prior to discharge.  Plan: Home Medications- usual; Home Treatments- rest; return here if the recommended treatment, does not improve the symptoms; Recommended follow up- PCP 1 week. Discuss getting Cardiac Stress test  Richarda Blade, MD 12/29/13 [phone]

## 2013-12-29 NOTE — Discharge Instructions (Signed)
Chest Pain (Nonspecific) °It is often hard to give a specific diagnosis for the cause of chest pain. There is always a chance that your pain could be related to something serious, such as a heart attack or a blood clot in the lungs. You need to follow up with your caregiver for further evaluation. °CAUSES  °· Heartburn. °· Pneumonia or bronchitis. °· Anxiety or stress. °· Inflammation around your heart (pericarditis) or lung (pleuritis or pleurisy). °· A blood clot in the lung. °· A collapsed lung (pneumothorax). It can develop suddenly on its own (spontaneous pneumothorax) or from injury (trauma) to the chest. °· Shingles infection (herpes zoster virus). °The chest wall is composed of bones, muscles, and cartilage. Any of these can be the source of the pain. °· The bones can be bruised by injury. °· The muscles or cartilage can be strained by coughing or overwork. °· The cartilage can be affected by inflammation and become sore (costochondritis). °DIAGNOSIS  °Lab tests or other studies, such as X-rays, electrocardiography, stress testing, or cardiac imaging, may be needed to find the cause of your pain.  °TREATMENT  °· Treatment depends on what may be causing your chest pain. Treatment may include: °· Acid blockers for heartburn. °· Anti-inflammatory medicine. °· Pain medicine for inflammatory conditions. °· Antibiotics if an infection is present. °· You may be advised to change lifestyle habits. This includes stopping smoking and avoiding alcohol, caffeine, and chocolate. °· You may be advised to keep your head raised (elevated) when sleeping. This reduces the chance of acid going backward from your stomach into your esophagus. °· Most of the time, nonspecific chest pain will improve within 2 to 3 days with rest and mild pain medicine. °HOME CARE INSTRUCTIONS  °· If antibiotics were prescribed, take your antibiotics as directed. Finish them even if you start to feel better. °· For the next few days, avoid physical  activities that bring on chest pain. Continue physical activities as directed. °· Do not smoke. °· Avoid drinking alcohol. °· Only take over-the-counter or prescription medicine for pain, discomfort, or fever as directed by your caregiver. °· Follow your caregiver's suggestions for further testing if your chest pain does not go away. °· Keep any follow-up appointments you made. If you do not go to an appointment, you could develop lasting (chronic) problems with pain. If there is any problem keeping an appointment, you must call to reschedule. °SEEK MEDICAL CARE IF:  °· You think you are having problems from the medicine you are taking. Read your medicine instructions carefully. °· Your chest pain does not go away, even after treatment. °· You develop a rash with blisters on your chest. °SEEK IMMEDIATE MEDICAL CARE IF:  °· You have increased chest pain or pain that spreads to your arm, neck, jaw, back, or abdomen. °· You develop shortness of breath, an increasing cough, or you are coughing up blood. °· You have severe back or abdominal pain, feel nauseous, or vomit. °· You develop severe weakness, fainting, or chills. °· You have a fever. °THIS IS AN EMERGENCY. Do not wait to see if the pain will go away. Get medical help at once. Call your local emergency services (911 in [place] Do not drive yourself to the hospital. °MAKE SURE YOU:  °· Understand these instructions. °· Will watch your condition. °· Will get help right away if you are not doing well or get worse. °Document Released: 09/15/2005 Document Revised: 02/28/2012 Document Reviewed: 07/11/2008 °Exit[employer]® Patient Information ©2014 [employer]   [employer]. ° °

## 2013-12-29 NOTE — ED Notes (Signed)
Ice chips given per okay Dr Eulis Foster.

## 2013-12-29 NOTE — ED Notes (Signed)
Pt reports she was looking at family pictures when she began to have pain left trapezius area "like a spasm" that radiated to neck, jaw, and down arm.  Lasted approx 1.5 hrs.  Has since resolved.

## 2013-12-29 NOTE — ED Notes (Signed)
Continues to deny pain.  Chatting with daughter, no distress noted.

## 2014-01-23 ENCOUNTER — Ambulatory Visit (INDEPENDENT_AMBULATORY_CARE_PROVIDER_SITE_OTHER): Payer: 59 | Admitting: Cardiovascular Disease

## 2014-01-23 ENCOUNTER — Encounter: Payer: Self-pay | Admitting: Cardiovascular Disease

## 2014-01-23 VITALS — BP 120/76 | HR 77 | Ht 66.0 in | Wt 186.1 lb

## 2014-01-23 DIAGNOSIS — R079 Chest pain, unspecified: Secondary | ICD-10-CM

## 2014-01-23 DIAGNOSIS — E785 Hyperlipidemia, unspecified: Secondary | ICD-10-CM

## 2014-01-23 NOTE — Patient Instructions (Signed)
Your physician has requested that you have a stress echocardiogram. For further information please visit [email or web address]. Please follow instruction sheet as given.  Your physician wants you to follow-up in: 1 year with Dr. Burt Knack.  You will receive a reminder letter in the mail two months in advance. If you don't receive a letter, please call our office to schedule the follow-up appointment.  Call the office if you decide to have a CT Scan for Calcium Scoring

## 2014-01-23 NOTE — Progress Notes (Signed)
HPI:  68 year old woman presenting for initial cardiac evaluation. The patient was seen in the emergency department in December with chest pain. She was involved in emotional conversation with family member when she reached across the table and had a sudden pain in her left shoulder and left upper chest. She was concern that this was heart related and went to the emergency department for evaluation. Troponin levels were normal. Chest x-ray was normal. Her EKG showed no acute changes. She was reassured and released for cardiac followup.  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 She's had no long-term cardiac followup. She denies exertional chest pain or tightness. She's had a few other episodes of chest discomfort at rest. She denies edema, palpitations, orthopnea, or PND. She's been under a lot of stress over the last year because of her husband's illness. She is interested in starting an exercise program and has concerns about underlying cardiac disease. The patient does have hyperlipidemia and she had been on statin drugs for several years. She changed to red yeast rice because she was concerned about the long-term effects of statins. Lipids are followed by her primary physician.  Outpatient Encounter Prescriptions as of 01/23/2014  Medication Sig  . 5-Hydroxytryptophan (5-HTP PO) Take 2 tablets by mouth daily. 100 MG  . aminocaproic acid (AMICAR) 500 MG tablet Take 1,000 mg by mouth every 6 (six) hours.  Marland Kitchen CALCIUM PO Take 1 tablet by mouth daily.  . cetirizine (ZYRTEC) 10 MG tablet Take 10 mg by mouth daily as needed. For allergies   . Coenzyme Q10 (CO Q 10 PO) Take 100 mg by mouth daily.  . Cyanocobalamin (VITAMIN B-12 PO) Take by mouth.  . estradiol (VAGIFEM) 25 MCG vaginal tablet Place 25 mcg vaginally 2 (two) times a week. Sunday and thursday  . fluticasone (FLONASE) 50 MCG/ACT nasal spray Place 1 spray into both nostrils daily as needed for  allergies or rhinitis.  Marland Kitchen MAGNESIUM PO Take 1 tablet by mouth daily.  . montelukast (SINGULAIR) 10 MG tablet Take 10 mg by mouth at bedtime as needed (allergies).  . niacin 500 MG tablet Take 500 mg by mouth at bedtime.  . NON FORMULARY 600 mg daily. Solaray 60 caps  . Nutritional Supplements (DHEA PO) Take 5 mg by mouth as directed.  . Nutritional Supplements (LITHATE PO) Take 1 tablet by mouth daily.  Marland Kitchen POTASSIUM PO Take 1 tablet by mouth daily.  . Probiotic Product (PROBIOTIC DAILY PO) Take by mouth as directed. 1-2 daily  . Red Yeast Rice 600 MG CAPS Take 1 capsule by mouth 2 (two) times daily.    Review of patient's allergies indicates no known allergies.  Past Medical History  Diagnosis Date  . PONV (postoperative nausea and vomiting)   . Hypercholesteremia     on crestor  . Blood transfusion     as child with heart surg-[facility]  . Arthritis   . Seasonal allergies     Past Surgical History  Procedure Laterality Date  . Patent ductus aorta correction  1957    catheterization and repair of patent ductus  . Appendectomy    . Laparotomy  1993    ovarian cystectomy  . Tonsillectomy    . Mouth surgery    . Abdominal hysterectomy  11/17/2011    Procedure: HYSTERECTOMY ABDOMINAL;  Surgeon: Cyril Mourning, MD;  Location: [facility];  Service: Gynecology;  Laterality: N/A;  . Salpingoophorectomy  11/17/2011  Procedure: SALPINGO OOPHERECTOMY;  Surgeon: Cyril Mourning, MD;  Location: [facility];  Service: Gynecology;  Laterality: Bilateral;    History   Social History  . Marital Status: Married    Spouse Name: N/A    Number of Children: N/A  . Years of Education: N/A   Occupational History  . Not on file.   Social History Main Topics  . Smoking status: Former Smoker    Quit date: 11/05/1987  . Smokeless tobacco: Not on file  . Alcohol Use: No     Comment: history of alcohol abuse-has not used since 1986  . Drug Use: No     Comment: has not used since 1986-marijuana  only  . Sexual Activity: Not on file   Other Topics Concern  . Not on file   Social History Narrative  . No narrative on file    Family History  Problem Relation Age of Onset  . Hypertension Mother   . Transient ischemic attack Mother     2011 and 2012  . Arrhythmia Father     Pacemaker    ROS:  General: no fevers/chills/night sweats Eyes: no blurry vision, diplopia, or amaurosis ENT: no sore throat or hearing loss Resp: no cough, wheezing, or hemoptysis CV: no edema or palpitations GI: no abdominal pain, nausea, vomiting, diarrhea. Positive for constipation.  GU: no dysuria, frequency, or hematuria Skin: no rash Neuro: no headache, numbness, tingling, or weakness of extremities Musculoskeletal: no joint pain or swelling Heme: no bleeding, DVT, or easy bruising Endo: no polydipsia or polyuria  BP 120/76  Pulse 77  Ht 5\' 6"  (1.676 m)  Wt 186 lb 1.9 oz (84.423 kg)  BMI 30.05 kg/m2  SpO2 99%  PHYSICAL EXAM: Pt is alert and oriented, WD, WN, in no distress. HEENT: normal Neck: JVP normal. Carotid upstrokes normal without bruits. No thyromegaly. Lungs: equal expansion, clear bilaterally CV: Apex is discrete and nondisplaced, RRR without murmur or gallop Abd: soft, NT, +BS, no bruit, no hepatosplenomegaly Back: no CVA tenderness Ext: no C/C/E        DP/PT pulses intact and = Skin: warm and dry without rash Neuro: CNII-XII intact             Strength intact = bilaterally  EKG:  Possible ectopic atrial rhythm at 76 beats per minute with abnormal P wave axis. No significant ST or T-wave changes.  ASSESSMENT AND PLAN: 1. Chest pain with typical and atypical features. Suspect her episode in December was related to emotional stress and/or a musculoskeletal issue. The patient does have a history of surgical repair of a patent ductus. She has cardiac risk factors of former tobacco use and hyperlipidemia. I think a stress echocardiogram is indicated for further evaluation.  She is interested in assessment of coronary plaque and I reviewed diagnostic considerations. She may pursue a coronary calcium CT scan.  2. Hyperlipidemia. Followed by primary physician. If she does have a calcium score that is elevated, I would favor a statin drug. Otherwise it would be reasonable to continue on her current treatment plan.  For followup I would like to see her back in one year. We'll be in touch if there are any abnormalities on her stress echocardiogram. I do not appreciate any physical exam evidence of residual structural heart disease after her surgical PDA repair.  Sherren Mocha 01/23/2014 6:02 PM

## 2014-02-18 ENCOUNTER — Encounter: Payer: Self-pay | Admitting: Cardiovascular Disease

## 2014-02-18 ENCOUNTER — Ambulatory Visit (HOSPITAL_COMMUNITY): Payer: Medicare Other | Attending: Cardiovascular Disease

## 2014-02-18 DIAGNOSIS — I2510 Atherosclerotic heart disease of native coronary artery without angina pectoris: Secondary | ICD-10-CM | POA: Insufficient documentation

## 2014-02-18 DIAGNOSIS — Z87891 Personal history of nicotine dependence: Secondary | ICD-10-CM | POA: Insufficient documentation

## 2014-02-18 DIAGNOSIS — R079 Chest pain, unspecified: Secondary | ICD-10-CM

## 2014-02-18 DIAGNOSIS — Q250 Patent ductus arteriosus: Secondary | ICD-10-CM | POA: Insufficient documentation

## 2014-02-18 DIAGNOSIS — R072 Precordial pain: Secondary | ICD-10-CM | POA: Insufficient documentation

## 2014-02-18 MED ORDER — PERFLUTREN PROTEIN A MICROSPH IV SUSP
3.0000 mL | Freq: Once | INTRAVENOUS | Status: AC
  Administered 2014-02-18: 3 mL via INTRAVENOUS

## 2014-02-18 NOTE — Progress Notes (Signed)
Stress Echocardiogram performed with [employer].  

## 2014-02-18 NOTE — Addendum Note (Signed)
Addended by: Gretta Began on: 02/18/2014 04:27 PM   Modules accepted: Orders

## 2014-02-28 ENCOUNTER — Telehealth: Payer: Self-pay | Admitting: Cardiovascular Disease

## 2014-02-28 NOTE — Telephone Encounter (Signed)
New Message:  Pt is requesting a call back with her test results... Pt states it is ok to leave a detailed message on her cell phone.

## 2014-02-28 NOTE — Telephone Encounter (Signed)
Reviewed echo results with patient who verbalized understanding.    

## 2014-07-10 ENCOUNTER — Other Ambulatory Visit: Payer: Self-pay | Admitting: Dermatology

## 2014-08-22 IMAGING — CR DG CHEST 1V PORT
1 series · 1 of 1 positions shown · non-contrast
Comparison: None.

CLINICAL DATA: Chest pain.

EXAM:
PORTABLE CHEST - 1 VIEW

[AP]
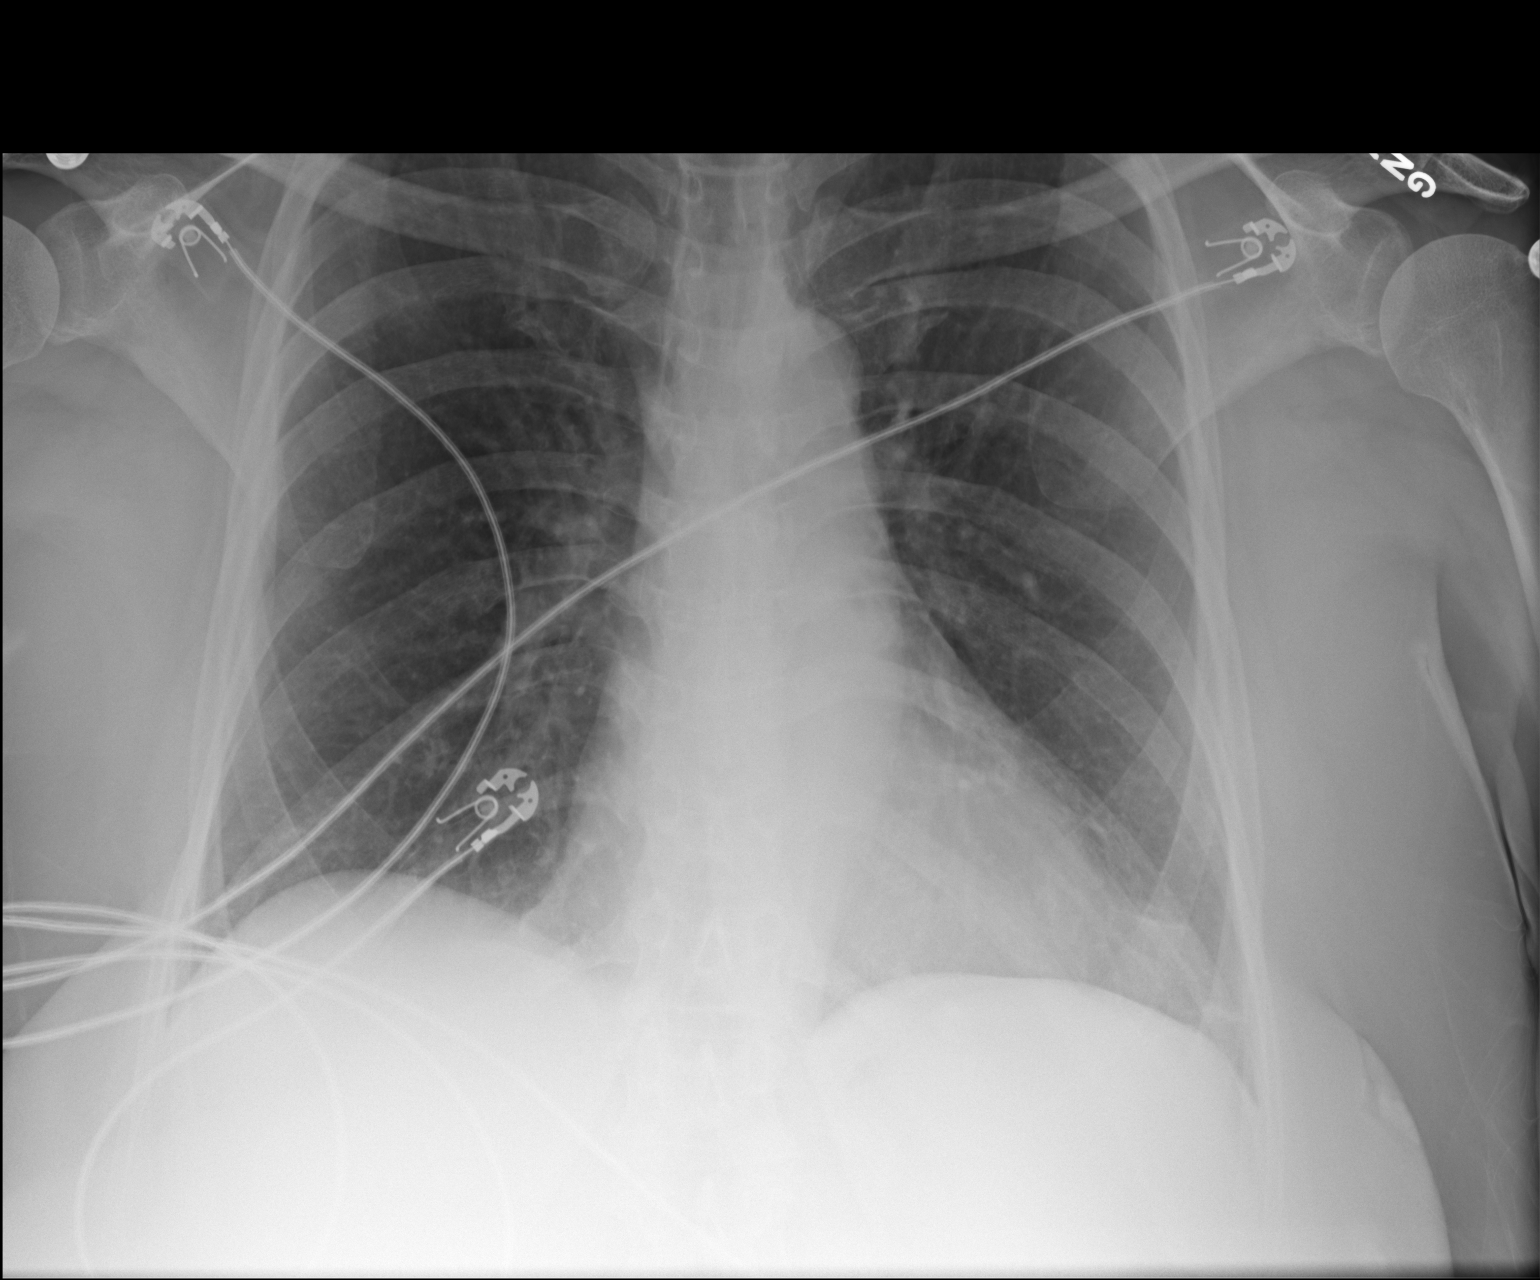

[1 of 1 positions shown; findings below may reference images not displayed]

FINDINGS: The heart size and mediastinal contours are within normal limits.
Both lungs are clear. No pneumothorax or pleural effusion is noted.
The visualized skeletal structures are unremarkable.
IMPRESSION: No acute cardiopulmonary abnormality seen.

## 2014-09-08 ENCOUNTER — Ambulatory Visit (INDEPENDENT_AMBULATORY_CARE_PROVIDER_SITE_OTHER): Payer: Medicare Other

## 2014-09-08 DIAGNOSIS — Z23 Encounter for immunization: Secondary | ICD-10-CM

## 2015-02-23 ENCOUNTER — Ambulatory Visit (INDEPENDENT_AMBULATORY_CARE_PROVIDER_SITE_OTHER): Payer: Medicare Other | Admitting: Emergency Medicine

## 2015-02-23 VITALS — BP 110/78 | HR 70 | Temp 98.7°F | Resp 20 | Ht 65.75 in | Wt 186.1 lb

## 2015-02-23 DIAGNOSIS — R059 Cough, unspecified: Secondary | ICD-10-CM

## 2015-02-23 DIAGNOSIS — R05 Cough: Secondary | ICD-10-CM | POA: Diagnosis not present

## 2015-02-23 DIAGNOSIS — J029 Acute pharyngitis, unspecified: Secondary | ICD-10-CM | POA: Diagnosis not present

## 2015-02-23 DIAGNOSIS — J0140 Acute pansinusitis, unspecified: Secondary | ICD-10-CM | POA: Diagnosis not present

## 2015-02-23 LAB — POCT INFLUENZA A/B
Influenza A, POC: NEGATIVE
Influenza B, POC: NEGATIVE

## 2015-02-23 LAB — POCT RAPID STREP A (OFFICE): RAPID STREP A SCREEN: NEGATIVE

## 2015-02-23 MED ORDER — TEMAZEPAM 30 MG PO CAPS: 30.0000 mg | ORAL_CAPSULE | Freq: Every evening | ORAL | Status: AC | PRN

## 2015-02-23 MED ORDER — AMOXICILLIN-POT CLAVULANATE 875-125 MG PO TABS: 1.0000 | ORAL_TABLET | Freq: Two times a day (BID) | ORAL | Status: DC

## 2015-02-23 MED ORDER — PSEUDOEPHEDRINE-GUAIFENESIN ER 60-600 MG PO TB12: 1.0000 | ORAL_TABLET | Freq: Two times a day (BID) | ORAL | Status: DC

## 2015-02-23 NOTE — Patient Instructions (Addendum)
Insomnia Insomnia is frequent trouble falling and/or staying asleep. Insomnia can be a long term problem or a short term problem. Both are common. Insomnia can be a short term problem when the wakefulness is related to a certain stress or worry. Long term insomnia is often related to ongoing stress during waking hours and/or poor sleeping habits. Overtime, sleep deprivation itself can make the problem worse. Every little thing feels more severe because you are overtired and your ability to cope is decreased. CAUSES   Stress, anxiety, and depression.  Poor sleeping habits.  Distractions such as TV in the bedroom.  Naps close to bedtime.  Engaging in emotionally charged conversations before bed.  Technical reading before sleep.  Alcohol and other sedatives. They may make the problem worse. They can hurt normal sleep patterns and normal dream activity.  Stimulants such as caffeine for several hours prior to bedtime.  Pain syndromes and shortness of breath can cause insomnia.  Exercise late at night.  Changing time zones may cause sleeping problems (jet lag). It is sometimes helpful to have someone observe your sleeping patterns. They should look for periods of not breathing during the night (sleep apnea). They should also look to see how long those periods last. If you live alone or observers are uncertain, you can also be observed at a sleep clinic where your sleep patterns will be professionally monitored. Sleep apnea requires a checkup and treatment. Give your caregivers your medical history. Give your caregivers observations your family has made about your sleep.  SYMPTOMS   Not feeling rested in the morning.  Anxiety and restlessness at bedtime.  Difficulty falling and staying asleep. TREATMENT   Your caregiver may prescribe treatment for an underlying medical disorders. Your caregiver can give advice or help if you are using alcohol or other drugs for self-medication. Treatment  of underlying problems will usually eliminate insomnia problems.  Medications can be prescribed for short time use. They are generally not recommended for lengthy use.  Over-the-counter sleep medicines are not recommended for lengthy use. They can be habit forming.  You can promote easier sleeping by making lifestyle changes such as:  Using relaxation techniques that help with breathing and reduce muscle tension.  Exercising earlier in the day.  Changing your diet and the time of your last meal. No night time snacks.  Establish a regular time to go to bed.  Counseling can help with stressful problems and worry.  Soothing music and white noise may be helpful if there are background noises you cannot remove.  Stop tedious detailed work at least one hour before bedtime. HOME CARE INSTRUCTIONS   Keep a diary. Inform your caregiver about your progress. This includes any medication side effects. See your caregiver regularly. Take note of:  Times when you are asleep.  Times when you are awake during the night.  The quality of your sleep.  How you feel the next day. This information will help your caregiver care for you.  Get out of bed if you are still awake after 15 minutes. Read or do some quiet activity. Keep the lights down. Wait until you feel sleepy and go back to bed.  Keep regular sleeping and waking hours. Avoid naps.  Exercise regularly.  Avoid distractions at bedtime. Distractions include watching television or engaging in any intense or detailed activity like attempting to balance the household checkbook.  Develop a bedtime ritual. Keep a familiar routine of bathing, brushing your teeth, climbing into bed at the same   time each night, listening to soothing music. Routines increase the success of falling to sleep faster.  Use relaxation techniques. This can be using breathing and muscle tension release routines. It can also include visualizing peaceful scenes. You can  also help control troubling or intruding thoughts by keeping your mind occupied with boring or repetitive thoughts like the old concept of counting sheep. You can make it more creative like imagining planting one beautiful flower after another in your backyard garden.  During your day, work to eliminate stress. When this is not possible use some of the previous suggestions to help reduce the anxiety that accompanies stressful situations. MAKE SURE YOU:   Understand these instructions.  Will watch your condition.  Will get help right away if you are not doing well or get worse. Document Released: 12/03/2000 Document Revised: 02/28/2012 Document Reviewed: 01/03/2008 [facility] Patient Information 2015 [place], [place]. This information is not intended to replace advice given to you by your health care provider. Make sure you discuss any questions you have with your health care provider.     Sinusitis Sinusitis is redness, soreness, and inflammation of the paranasal sinuses. Paranasal sinuses are air pockets within the bones of your face (beneath the eyes, the middle of the forehead, or above the eyes). In healthy paranasal sinuses, mucus is able to drain out, and air is able to circulate through them by way of your nose. However, when your paranasal sinuses are inflamed, mucus and air can become trapped. This can allow bacteria and other germs to grow and cause infection. Sinusitis can develop quickly and last only a short time (acute) or continue over a long period (chronic). Sinusitis that lasts for more than 12 weeks is considered chronic.  CAUSES  Causes of sinusitis include:  Allergies.  Structural abnormalities, such as displacement of the cartilage that separates your nostrils (deviated septum), which can decrease the air flow through your nose and sinuses and affect sinus drainage.  Functional abnormalities, such as when the small hairs (cilia) that line your sinuses and help remove  mucus do not work properly or are not present. SIGNS AND SYMPTOMS  Symptoms of acute and chronic sinusitis are the same. The primary symptoms are pain and pressure around the affected sinuses. Other symptoms include:  Upper toothache.  Earache.  Headache.  Bad breath.  Decreased sense of smell and taste.  A cough, which worsens when you are lying flat.  Fatigue.  Fever.  Thick drainage from your nose, which often is green and may contain pus (purulent).  Swelling and warmth over the affected sinuses. DIAGNOSIS  Your health care provider will perform a physical exam. During the exam, your health care provider may:  Look in your nose for signs of abnormal growths in your nostrils (nasal polyps).  Tap over the affected sinus to check for signs of infection.  View the inside of your sinuses (endoscopy) using an imaging device that has a light attached (endoscope). If your health care provider suspects that you have chronic sinusitis, one or more of the following tests may be recommended:  Allergy tests.  Nasal culture. A sample of mucus is taken from your nose, sent to a lab, and screened for bacteria.  Nasal cytology. A sample of mucus is taken from your nose and examined by your health care provider to determine if your sinusitis is related to an allergy. TREATMENT  Most cases of acute sinusitis are related to a viral infection and will resolve on their own  within 10 days. Sometimes medicines are prescribed to help relieve symptoms (pain medicine, decongestants, nasal steroid sprays, or saline sprays).  However, for sinusitis related to a bacterial infection, your health care provider will prescribe antibiotic medicines. These are medicines that will help kill the bacteria causing the infection.  Rarely, sinusitis is caused by a fungal infection. In theses cases, your health care provider will prescribe antifungal medicine. For some cases of chronic sinusitis, surgery is  needed. Generally, these are cases in which sinusitis recurs more than 3 times per year, despite other treatments. HOME CARE INSTRUCTIONS   Drink plenty of water. Water helps thin the mucus so your sinuses can drain more easily.  Use a humidifier.  Inhale steam 3 to 4 times a day (for example, sit in the bathroom with the shower running).  Apply a warm, moist washcloth to your face 3 to 4 times a day, or as directed by your health care provider.  Use saline nasal sprays to help moisten and clean your sinuses.  Take medicines only as directed by your health care provider.  If you were prescribed either an antibiotic or antifungal medicine, finish it all even if you start to feel better. SEEK IMMEDIATE MEDICAL CARE IF:  You have increasing pain or severe headaches.  You have nausea, vomiting, or drowsiness.  You have swelling around your face.  You have vision problems.  You have a stiff neck.  You have difficulty breathing. MAKE SURE YOU:   Understand these instructions.  Will watch your condition.  Will get help right away if you are not doing well or get worse. Document Released: 12/06/2005 Document Revised: 04/22/2014 Document Reviewed: 12/21/2011 [facility] Patient Information 2015 [place], [place]. This information is not intended to replace advice given to you by your health care provider. Make sure you discuss any questions you have with your health care provider.

## 2015-02-23 NOTE — Progress Notes (Signed)
[facility] [address] [place] [address] [phone]  Date:  02/23/2015   Name:  Leslie Rich   DOB:  Mar 11, 1946   MRN:  [id number]  PCP:  Leslie Rich., MD    Chief Complaint: Sore Throat; Back Ache; and Ear Pain   History of Present Illness:  Leslie Rich is a 69 y.o. very pleasant female patient who presents with the following:  Ill with sore throat.  Nasal congestion  Mucoid in nature No cough or wheezing or shortness of breath Mother died recently and husband is terminally ill. Has headache and pressure in ears. no fever  or chills Has low back pain Fatigue.  Malaise.  Poor appetite No nausea or vomiting.  No stool change No improvement with over the counter medications or other home remedies.  Denies other complaint or health concern today.   Patient Active Problem List   Diagnosis Date Noted  . Chest pain at rest 01/23/2014  . Hyperlipidemia 01/23/2014    Past Medical History  Diagnosis Date  . PONV (postoperative nausea and vomiting)   . Hypercholesteremia     on crestor  . Blood transfusion     as child with heart surg-[facility]  . Arthritis   . Seasonal allergies     Past Surgical History  Procedure Laterality Date  . Patent ductus aorta correction  1957    catheterization and repair of patent ductus  . Appendectomy    . Laparotomy  1993    ovarian cystectomy  . Tonsillectomy    . Mouth surgery    . Abdominal hysterectomy  11/17/2011    Procedure: HYSTERECTOMY ABDOMINAL;  Surgeon: Cyril Mourning, MD;  Location: [facility];  Service: Gynecology;  Laterality: N/A;  . Salpingoophorectomy  11/17/2011    Procedure: SALPINGO OOPHERECTOMY;  Surgeon: Cyril Mourning, MD;  Location: [facility];  Service: Gynecology;  Laterality: Bilateral;    History  Substance Use Topics  . Smoking status: Former Smoker    Quit date: 11/05/1987  . Smokeless tobacco: Never Used  . 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 Hypertension Mother   . Transient ischemic attack Mother     2011 and 2012  . Arrhythmia Father     Pacemaker    No Known Allergies  Medication list has been reviewed and updated.  Current Outpatient Prescriptions on File Prior to Visit  Medication Sig Dispense Refill  . 5-Hydroxytryptophan (5-HTP PO) Take 2 tablets by mouth daily. 100 MG    . aminocaproic acid (AMICAR) 500 MG tablet Take 1,000 mg by mouth every 6 (six) hours.    Marland Kitchen CALCIUM PO Take 1 tablet by mouth daily.    . cetirizine (ZYRTEC) 10 MG tablet Take 10 mg by mouth daily as needed. For allergies     . Coenzyme Q10 (CO Q 10 PO) Take 100 mg by mouth daily.    . Cyanocobalamin (VITAMIN B-12 PO) Take by mouth.    . estradiol (VAGIFEM) 25 MCG vaginal tablet Place 25 mcg vaginally 2 (two) times a week. Sunday and thursday    . fluticasone (FLONASE) 50 MCG/ACT nasal spray Place 1 spray into both nostrils daily as needed for allergies or rhinitis.    Marland Kitchen MAGNESIUM PO Take 1 tablet by mouth daily.    . montelukast (SINGULAIR) 10 MG tablet Take 10  mg by mouth at bedtime as needed (allergies).    . niacin 500 MG tablet Take 500 mg by mouth at bedtime.    . NON FORMULARY 600 mg daily. Solaray 60 caps    . Nutritional Supplements (DHEA PO) Take 5 mg by mouth as directed.    . Nutritional Supplements (LITHATE PO) Take 1 tablet by mouth daily.    Marland Kitchen POTASSIUM PO Take 1 tablet by mouth daily.    . Probiotic Product (PROBIOTIC DAILY PO) Take by mouth as directed. 1-2 daily    . Red Yeast Rice 600 MG CAPS Take 1 capsule by mouth 2 (two) times daily.     No current facility-administered medications on file prior to visit.    Review of Systems:  As per HPI, otherwise negative.    Physical Examination: Filed Vitals:   02/23/15 1506  BP: 110/78  Pulse: 70  Temp: 98.7 F (37.1 C)  Resp: 20   Filed Vitals:   02/23/15 1506  Height: 5' 5.75"  (1.67 m)  Weight: 186 lb 2 oz (84.426 kg)   Body mass index is 30.27 kg/(m^2). Ideal Body Weight: Weight in (lb) to have BMI = 25: 153.4  GEN: WDWN, NAD, Non-toxic, A & O x 3 HEENT: Atraumatic, Normocephalic. Neck supple. No masses, No LAD. Ears and Nose: No external deformity. CV: RRR, No M/G/R. No JVD. No thrill. No extra heart sounds. PULM: CTA B, no wheezes, crackles, rhonchi. No retractions. No resp. distress. No accessory muscle use. ABD: S, NT, ND, +BS. No rebound. No HSM. EXTR: No c/c/e NEURO Normal gait.  PSYCH: Normally interactive. Conversant. Not depressed or anxious appearing.  Calm demeanor.    Assessment and Plan: Sinusitis augmentin mucinex  Signed,  Ellison Carwin, MD   Results for orders placed or performed in visit on 02/23/15  POCT rapid strep A  Result Value Ref Range   Rapid Strep A Screen Negative Negative  POCT Influenza A/B  Result Value Ref Range   Influenza A, POC Negative    Influenza B, POC Negative

## 2015-06-23 ENCOUNTER — Ambulatory Visit (INDEPENDENT_AMBULATORY_CARE_PROVIDER_SITE_OTHER): Payer: Medicare Other | Admitting: Physician Assistant

## 2015-06-23 VITALS — BP 124/80 | HR 73 | Temp 98.2°F | Resp 18 | Ht 67.75 in | Wt 188.4 lb

## 2015-06-23 DIAGNOSIS — Z4802 Encounter for removal of sutures: Secondary | ICD-10-CM

## 2015-06-23 NOTE — Patient Instructions (Signed)

## 2015-06-23 NOTE — Progress Notes (Signed)
Patient ID: Leslie Leslie [name], female    DOB: 07-29-1946, 69 y.o.   MRN: [id number]  PCP: Leslie Leslie [name]., MD   Subjective:  HPI Pt is a 69 y/o female presenting to clinic for suture removal of sutures not placed at [facility].  Pt reports that, 9 days ago (06/14/15), she reached down to pick up a piece of pottery before noticing that is was broken and cut the dorsum of her left hand on the broken piece of pottery. She was in [facility] [place] at the time and went to an Urgent Care located there on 06/14/15 for evaluation. The facility cleaned the wound and placed 8 simple interrupted sutures to close the wound. Pt states she was unsure of her last Tdap at the time, so she received a Tdap vaccine at the urgent care on 06/14/15. Pt denies any complications or uncontrolled bleeding at the time. She has kept the wound clean and covered during the day, and removes the dressing at night to "air it out". She thought that she noticed some pus on the wound last night and poured some hydrogen peroxide on it. Afterwards, she realized that she had gotten band-aid wet and the "white stuff" she had seen was probably wet scab rather than pus. Pt denies fevers/ chills, redness/swelling/heat of the area, or any drainage from the wound.   Pt states that she has been under a lot of stress recently. Her husband was diagnosed with recurrent stage IV bladder cancer 2 years ago, and is currently living at home with her as a caregiver and a palliative care NP visiting as needed. He has declined significantly to to point where he is losing his short term memory and is only expected to live another few months. Pt states that she has a lot of quality support at home, but it has still been very difficult for her to watch his decline. Daughter, Leslie Leslie [name], is transferring the [facility] and moving back to the area to be around family.   Review of Systems  Constitutional: Negative.   Respiratory: Negative.   Cardiovascular: Negative.     Musculoskeletal: Negative.   Skin: Positive for wound (left dorsal side of hand with 8 sutures in place, healing well). Negative for color change, pallor and rash.  Neurological: Negative.      Patient Active Problem List   Diagnosis Date Noted  . Chest pain at rest 01/23/2014  . Hyperlipidemia 01/23/2014    Past Medical History  Diagnosis Date  . PONV (postoperative nausea and vomiting)   . Hypercholesteremia     on crestor  . Blood transfusion     as child with heart surg-[facility]  . Arthritis   . Seasonal allergies     Prior to Admission medications   Medication Sig Start Date Leslie [name] Date Taking? Authorizing Provider  5-Hydroxytryptophan (5-HTP PO) Take 2 tablets by mouth daily. 100 MG   Yes Historical Provider, MD  aminocaproic acid (AMICAR) 500 MG tablet Take 1,000 mg by mouth every 6 (six) hours.   Yes Historical Provider, MD  CALCIUM PO Take 1 tablet by mouth daily.   Yes Historical Provider, MD  cetirizine (ZYRTEC) 10 MG tablet Take 10 mg by mouth daily as needed. For allergies    Yes Historical Provider, MD  Coenzyme Q10 (CO Q 10 PO) Take 100 mg by mouth daily.   Yes Historical Provider, MD  Cyanocobalamin (VITAMIN B-12 PO) Take by mouth.   Yes Historical Provider, MD  estradiol (VAGIFEM) 25 MCG vaginal  tablet Place 25 mcg vaginally 2 (two) times a week. Sunday and thursday   Yes Historical Provider, MD  fluticasone (FLONASE) 50 MCG/ACT nasal spray Place 1 spray into both nostrils daily as needed for allergies or rhinitis.   Yes Historical Provider, MD  MAGNESIUM PO Take 1 tablet by mouth daily.   Yes Historical Provider, MD  montelukast (SINGULAIR) 10 MG tablet Take 10 mg by mouth at bedtime as needed (allergies).   Yes Historical Provider, MD  niacin 500 MG tablet Take 500 mg by mouth at bedtime.   Yes Historical Provider, MD  NON FORMULARY 600 mg daily. Solaray 60 caps   Yes Historical Provider, MD  Nutritional Supplements (DHEA PO) Take 5 mg by mouth as directed.   Yes  Historical Provider, MD  Nutritional Supplements (LITHATE PO) Take 1 tablet by mouth daily.   Yes Historical Provider, MD  POTASSIUM PO Take 1 tablet by mouth daily.   Yes Historical Provider, MD  Probiotic Product (PROBIOTIC DAILY PO) Take by mouth as directed. 1-2 daily   Yes Historical Provider, MD  Red Yeast Rice 600 MG CAPS Take 1 capsule by mouth 2 (two) times daily.   Yes Historical Provider, MD  temazepam (RESTORIL) 30 MG capsule Take 1 capsule (30 mg total) by mouth at bedtime as needed for sleep. Patient not taking: Reported on 06/23/2015 02/23/15   Roselee Culver, MD    No Known Allergies  Past Medical, Surgical Family and Social History reviewed and updated.   Objective:   Vitals: BP 124/80 mmHg  Pulse 73  Temp(Src) 98.2 F (36.8 C) (Oral)  Resp 18  Ht 5' 7.75" (1.721 m)  Wt 188 lb 6.4 oz (85.458 kg)  BMI 28.85 kg/m2  SpO2 98%   Physical Exam  Constitutional: She is oriented to person, place, and time. She appears well-developed and well-nourished. She is cooperative. No distress.  HENT:  Head: Normocephalic and atraumatic.  Cardiovascular: Normal rate and regular rhythm.  Exam reveals no gallop and no friction rub.   No murmur heard. Pulmonary/Chest: Effort normal and breath sounds normal. No respiratory distress. She has no wheezes. She has no rales.  Musculoskeletal:       Right wrist: Normal.       Left wrist: Normal.       Right hand: Normal.       Left hand: She exhibits tenderness (around laceration) and laceration. She exhibits normal range of motion, no bony tenderness, normal capillary refill, no deformity and no swelling. Normal sensation noted. Normal strength noted.       Hands: Neurological: She is alert and oriented to person, place, and time.  Skin: Skin is warm and dry.  Psychiatric: She has a normal mood and affect. Her behavior is normal. Judgment and thought content normal.   Procedure: suture removal: Verbal consent obtained. 8 simple  interrupted sutures removed using pick-ups and suture scissors. No drainage or bleeding appreciated.  Assessment & Plan:   Malana was seen today for suture / staple removal.  Diagnoses and all orders for this visit:  Visit for suture removal  -  No signs or symptoms of infection, wound is healing well.  -  Pt should continue to keep wound clean.  -  She will return to clinic if she develops any signs or symptoms of infection.  Katsumi Wisler, PA-S [facility] 06/23/2015 4:34 PM

## 2015-06-24 NOTE — Progress Notes (Signed)
Patient ID: Leslie Rich, female    DOB: 1946-01-21, 69 y.o.   MRN: [id number]  PCP: Leslie Buster, PA-C  Subjective:   Chief Complaint  Patient presents with  . Suture / Staple Removal    Had stitches put in 06/14/15    HPI Presents for suture removal.  9 days ago (06/14/15), she reached down to pick up a piece of pottery before noticing that is was broken and cut the dorsum of her left hand on the broken piece of pottery. She was in [facility], [place] at the time and went to an Urgent Care located there for evaluation. The facility cleaned the wound and placed 8 simple interrupted sutures to close the wound. Pt states she was unsure of her last Tdap at the time, so she received a Tdap vaccine at the urgent care on 06/14/15.   Pt denies any complications or uncontrolled bleeding at the time. She has kept the wound clean and covered during the day, and removes the dressing at night to "air it out". She thought that she noticed some pus on the wound last night and poured some hydrogen peroxide on it. Afterwards, she realized that she had gotten band-aid wet and the "white stuff" she had seen was probably wet scab rather than pus. Pt denies fevers/ chills, redness/swelling/heat of the area, or any drainage from the wound.   Pt states that she has been under a lot of stress recently. Her husband was diagnosed with recurrent stage IV bladder cancer 2 years ago, and is currently living at home with her as a caregiver and a palliative care NP visiting as needed. He has declined significantly to to point where he is losing his short term memory and is only expected to live another few months. Pt states that she has a lot of quality support at home, but it has still been very difficult for her to watch his decline. Daughter, Leslie Rich, is transferring from [employer] to [employer]  to be around family.      Review of Systems Constitutional: Negative.  Respiratory: Negative.  Cardiovascular: Negative.    Musculoskeletal: Negative.  Skin: Positive for wound (left dorsal side of hand with 8 sutures in place, healing well). Negative for color change, pallor and rash.  Neurological: Negative.      Patient Active Problem List   Diagnosis Date Noted  . Chest pain at rest 01/23/2014  . Hyperlipidemia 01/23/2014     Prior to Admission medications   Medication Sig Start Date Rich Date Taking? Authorizing Provider  5-Hydroxytryptophan (5-HTP PO) Take 2 tablets by mouth daily. 100 MG   Yes Historical Provider, MD  aminocaproic acid (AMICAR) 500 MG tablet Take 1,000 mg by mouth every 6 (six) hours.   Yes Historical Provider, MD  CALCIUM PO Take 1 tablet by mouth daily.   Yes Historical Provider, MD  cetirizine (ZYRTEC) 10 MG tablet Take 10 mg by mouth daily as needed. For allergies    Yes Historical Provider, MD  Coenzyme Q10 (CO Q 10 PO) Take 100 mg by mouth daily.   Yes Historical Provider, MD  Cyanocobalamin (VITAMIN B-12 PO) Take by mouth.   Yes Historical Provider, MD  estradiol (VAGIFEM) 25 MCG vaginal tablet Place 25 mcg vaginally 2 (two) times a week. Sunday and thursday   Yes Historical Provider, MD  fluticasone (FLONASE) 50 MCG/ACT nasal spray Place 1 spray into both nostrils daily as needed for allergies or rhinitis.   Yes Historical Provider, MD  MAGNESIUM  PO Take 1 tablet by mouth daily.   Yes Historical Provider, MD  montelukast (SINGULAIR) 10 MG tablet Take 10 mg by mouth at bedtime as needed (allergies).   Yes Historical Provider, MD  niacin 500 MG tablet Take 500 mg by mouth at bedtime.   Yes Historical Provider, MD  NON FORMULARY 600 mg daily. Solaray 60 caps   Yes Historical Provider, MD  Nutritional Supplements (DHEA PO) Take 5 mg by mouth as directed.   Yes Historical Provider, MD  Nutritional Supplements (LITHATE PO) Take 1 tablet by mouth daily.   Yes Historical Provider, MD  POTASSIUM PO Take 1 tablet by mouth daily.   Yes Historical Provider, MD  Probiotic Product  (PROBIOTIC DAILY PO) Take by mouth as directed. 1-2 daily   Yes Historical Provider, MD  Red Yeast Rice 600 MG CAPS Take 1 capsule by mouth 2 (two) times daily.   Yes Historical Provider, MD  temazepam (RESTORIL) 30 MG capsule Take 1 capsule (30 mg total) by mouth at bedtime as needed for sleep. Patient not taking: Reported on 06/23/2015 02/23/15   Leslie Culver, MD     No Known Allergies     Objective:  Physical Exam  Constitutional: She is oriented to person, place, and time. She appears well-developed and well-nourished. She is active and cooperative. No distress.  BP 124/80 mmHg  Pulse 73  Temp(Src) 98.2 F (36.8 C) (Oral)  Resp 18  Ht 5' 7.75" (1.721 m)  Wt 188 lb 6.4 oz (85.458 kg)  BMI 28.85 kg/m2  SpO2 98%   Eyes: Conjunctivae are normal.  Pulmonary/Chest: Effort normal.  Neurological: She is alert and oriented to person, place, and time.  Skin: Skin is warm. Laceration (well-healed 3 cm laceration on the lateral aspect of the dorsum of the left hand. ) noted. No rash noted.  Sutures removed without incident. Minimal suture reaction. No induration. No drainage.  Psychiatric: She has a normal mood and affect. Her speech is normal and behavior is normal.           Assessment & Plan:   1. Visit for suture removal Local wound care.  She will be moving to [facility], [place] next year to be closer to family once her husband dies. She needs a new PCP while she's still here and I agree to be that for her. She will get her records from [facility] sent to [place] and she will schedule a CPE at her convenience.   Leslie Chute, PA-C Physician Assistant-Certified [facility] .

## 2015-08-04 ENCOUNTER — Encounter: Payer: Self-pay | Admitting: Emergency Medicine

## 2015-08-04 ENCOUNTER — Ambulatory Visit (INDEPENDENT_AMBULATORY_CARE_PROVIDER_SITE_OTHER): Payer: Medicare Other | Admitting: Emergency Medicine

## 2015-08-04 ENCOUNTER — Ambulatory Visit: Payer: Self-pay

## 2015-08-04 VITALS — BP 142/80 | HR 79 | Temp 97.7°F | Resp 20

## 2015-08-04 DIAGNOSIS — R112 Nausea with vomiting, unspecified: Secondary | ICD-10-CM | POA: Diagnosis not present

## 2015-08-04 LAB — POCT CBC
GRANULOCYTE PERCENT: 60.1 % (ref 37–80)
HCT, POC: 41.2 % (ref 37.7–47.9)
Hemoglobin: 12.6 g/dL (ref 12.2–16.2)
Lymph, poc: 2.1 (ref 0.6–3.4)
MCH: 26.6 pg — AB (ref 27–31.2)
MCHC: 30.5 g/dL — AB (ref 31.8–35.4)
MCV: 87.2 fL (ref 80–97)
MID (CBC): 0.3 (ref 0–0.9)
MPV: 7.2 fL (ref 0–99.8)
POC Granulocyte: 3.5 (ref 2–6.9)
POC LYMPH PERCENT: 34.5 %L (ref 10–50)
POC MID %: 4.4 % (ref 0–12)
Platelet Count, POC: 241 10*3/uL (ref 142–424)
RBC: 4.73 M/uL (ref 4.04–5.48)
RDW, POC: 13.5 %
WBC: 5.8 10*3/uL (ref 4.6–10.2)

## 2015-08-04 LAB — COMPREHENSIVE METABOLIC PANEL
ALBUMIN: 4.1 g/dL (ref 3.6–5.1)
ALK PHOS: 48 U/L (ref 33–130)
ALT: 22 U/L (ref 6–29)
AST: 23 U/L (ref 10–35)
BILIRUBIN TOTAL: 0.6 mg/dL (ref 0.2–1.2)
BUN: 14 mg/dL (ref 7–25)
CALCIUM: 9.6 mg/dL (ref 8.6–10.4)
CO2: 21 mmol/L (ref 20–31)
CREATININE: 0.71 mg/dL (ref 0.50–0.99)
Chloride: 104 mmol/L (ref 98–110)
Glucose, Bld: 99 mg/dL (ref 65–99)
POTASSIUM: 3.5 mmol/L (ref 3.5–5.3)
Sodium: 142 mmol/L (ref 135–146)
Total Protein: 6.8 g/dL (ref 6.1–8.1)

## 2015-08-04 LAB — LIPASE: LIPASE: 23 U/L (ref 7–60)

## 2015-08-04 LAB — GLUCOSE, POCT (MANUAL RESULT ENTRY): POC Glucose: 95 mg/dl (ref 70–99)

## 2015-08-04 LAB — AMYLASE: Amylase: 29 U/L (ref 0–105)

## 2015-08-04 MED ORDER — PROMETHAZINE HCL 25 MG PO TABS: 25.0000 mg | ORAL_TABLET | Freq: Four times a day (QID) | ORAL | Status: AC | PRN

## 2015-08-04 MED ORDER — PROMETHAZINE HCL 25 MG/ML IJ SOLN
25.0000 mg | Freq: Once | INTRAMUSCULAR | Status: AC
  Administered 2015-08-04: 25 mg via INTRAMUSCULAR

## 2015-08-04 MED ORDER — PROMETHAZINE HCL 25 MG/ML IJ SOLN
50.0000 mg | Freq: Once | INTRAMUSCULAR | Status: AC
  Administered 2015-08-04: 50 mg via INTRAMUSCULAR

## 2015-08-04 NOTE — Patient Instructions (Signed)
Clear Liquid Diet A clear liquid diet is a short-term diet that is prescribed to provide the necessary fluid and basic energy you need when you can have nothing else. The clear liquid diet consists of liquids or solids that will become liquid at room temperature. You should be able to see through the liquid. There are many reasons that you may be restricted to clear liquids, such as:  When you have a sudden-onset (acute) condition that occurs before or after surgery.  To help your body slowly get adjusted to food again after a long period when you were unable to have food.  Replacement of fluids when you have a diarrheal disease.  When you are going to have certain exams, such as a colonoscopy, in which instruments are inserted inside your body to look at parts of your digestive system. WHAT CAN I HAVE? A clear liquid diet does not provide all the nutrients you need. It is important to choose a variety of the following items to get as many nutrients as possible:  Vegetable juices that do not have pulp.  Fruit juices and fruit drinks that do not have pulp.  Coffee (regular or decaffeinated), tea, or soda at the discretion of your health care provider.  Clear bouillon, broth, or strained broth-based soups.  High-protein and flavored gelatins.  Sugar or honey.  Ices or frozen ice pops that do not contain milk. If you are not sure whether you can have certain items, you should ask your health care provider. You may also ask your health care provider if there are any other clear liquid options. Document Released: 12/06/2005 Document Revised: 12/11/2013 Document Reviewed: 11/02/2013 [facility] Patient Information 2015 [place], [place]. This information is not intended to replace advice given to you by your health care provider. Make sure you discuss any questions you have with your health care provider.

## 2015-08-04 NOTE — Progress Notes (Signed)
Subjective:  Patient ID: Leslie Rich, female    DOB: [birth year]  Age: 69 y.o. MRN: [id number]  CC: Dizziness; Dehydration; and Nausea   HPI Sinai R Glassburn presents  with nausea and vomiting. She said that she went to her daughter's college orientation today which was held indoors incidentally and afterwards developed profound nausea. She vomited once after eating something but hasn't vomited since. She has no diarrhea. No fever chills. Her appendix is been removed she denies any fever or chills.  She suggested. Similar this before associated with vertigo. She has no vertigo today  History Rushie has a past medical history of PONV (postoperative nausea and vomiting); Hypercholesteremia; Blood transfusion; Arthritis; and Seasonal allergies.   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   Her  family history includes Arrhythmia in her father; Hypertension in her mother; Transient ischemic attack in her mother.  She   reports that she quit smoking about 27 years ago. She has never used smokeless tobacco. She reports that she does not drink alcohol or use illicit drugs.  Outpatient Prescriptions Prior to Visit  Medication Sig Dispense Refill  . 5-Hydroxytryptophan (5-HTP PO) Take 2 tablets by mouth daily. 100 MG    . aminocaproic acid (AMICAR) 500 MG tablet Take 1,000 mg by mouth every 6 (six) hours.    Marland Kitchen CALCIUM PO Take 1 tablet by mouth daily.    . cetirizine (ZYRTEC) 10 MG tablet Take 10 mg by mouth daily as needed. For allergies     . Coenzyme Q10 (CO Q 10 PO) Take 100 mg by mouth daily.    . Cyanocobalamin (VITAMIN B-12 PO) Take by mouth.    . estradiol (VAGIFEM) 25 MCG vaginal tablet Place 25 mcg vaginally 2 (two) times a week. Sunday and thursday    . fluticasone (FLONASE) 50 MCG/ACT nasal  spray Place 1 spray into both nostrils daily as needed for allergies or rhinitis.    Marland Kitchen MAGNESIUM PO Take 1 tablet by mouth daily.    . montelukast (SINGULAIR) 10 MG tablet Take 10 mg by mouth at bedtime as needed (allergies).    . niacin 500 MG tablet Take 500 mg by mouth at bedtime.    . NON FORMULARY 600 mg daily. Solaray 60 caps    . Nutritional Supplements (DHEA PO) Take 5 mg by mouth as directed.    . Nutritional Supplements (LITHATE PO) Take 1 tablet by mouth daily.    Marland Kitchen POTASSIUM PO Take 1 tablet by mouth daily.    . Probiotic Product (PROBIOTIC DAILY PO) Take by mouth as directed. 1-2 daily    . Red Yeast Rice 600 MG CAPS Take 1 capsule by mouth 2 (two) times daily.    . temazepam (RESTORIL) 30 MG capsule Take 1 capsule (30 mg total) by mouth at bedtime as needed for sleep. 30 capsule 1   No facility-administered medications prior to visit.    Social History   Social History  . Marital Status: Married    Spouse Name: N/A  . Number of Children: N/A  . Years of Education: N/A   Social History Main Topics  . Smoking status: Former Smoker    Quit date: 11/05/1987  . Smokeless tobacco: Never Used  . Alcohol Use: No     Comment: history of alcohol abuse-has not used since 1986  . Drug Use: No     Comment:  has not used since 1986-marijuana only  . Sexual Activity: Not Asked   Other Topics Concern  . None   Social History Narrative   Husband diagnosed with recurrence of stage IV bladder cancer 2 years ago. He is currently lives at home, pt is a caregiver, and they have a palliative care NP visit. She says he is expected to live a few more months. Pt states that she has a lot of quality support at home, but this is still very difficult to go through.      Daughter, [name], is transferring to [facility] and moving back to [place] to be closer to family.     Review of Systems  Constitutional: Positive for fatigue. Negative for fever, chills and appetite change.  HENT: Negative  for congestion, ear pain, postnasal drip, sinus pressure and sore throat.   Eyes: Negative for pain and redness.  Respiratory: Negative for cough, shortness of breath and wheezing.   Cardiovascular: Negative for leg swelling.  Gastrointestinal: Positive for nausea and vomiting. Negative for abdominal pain, diarrhea, constipation and blood in stool.  Endocrine: Negative for polyuria.  Genitourinary: Negative for dysuria, urgency, frequency and flank pain.  Musculoskeletal: Negative for gait problem.  Skin: Negative for rash.  Neurological: Negative for weakness and headaches.  Psychiatric/Behavioral: Negative for confusion and decreased concentration. The patient is not nervous/anxious.     Objective:  BP 142/80 mmHg  Pulse 79  Temp(Src) 97.7 F (36.5 C) (Oral)  Resp 20  SpO2 98%  Physical Exam  Constitutional: She is oriented to person, place, and time. She appears well-developed and well-nourished. No distress.  HENT:  Head: Normocephalic and atraumatic.  Right Ear: External ear normal.  Left Ear: External ear normal.  Nose: Nose normal.  Eyes: Conjunctivae and EOM are normal. Pupils are equal, round, and reactive to light. No scleral icterus.  Neck: Normal range of motion. Neck supple. No tracheal deviation present.  Cardiovascular: Normal rate, regular rhythm and normal heart sounds.   Pulmonary/Chest: Effort normal. No respiratory distress. She has no wheezes. She has no rales.  Abdominal: She exhibits no mass. There is no tenderness. There is no rebound and no guarding.  Musculoskeletal: She exhibits no edema.  Lymphadenopathy:    She has no cervical adenopathy.  Neurological: She is alert and oriented to person, place, and time. Coordination normal.  Skin: Skin is warm and dry. No rash noted.  Psychiatric: She has a normal mood and affect. Her behavior is normal.   patient is moaning and requiring assistance ambulating. She seems well-hydrated. Her complaint of symptoms  seems way out of portion to her actual exam.    Assessment & Plan:   Nashly was seen today for dizziness, dehydration and nausea.  Diagnoses and all orders for this visit:  Nausea and vomiting, vomiting of unspecified type -     POCT CBC -     promethazine (PHENERGAN) injection 50 mg; Inject 2 mLs (50 mg total) into the muscle once. -     Cancel: DG Shoulder Left; Future -     POCT glucose (manual entry) -     EKG 12-Lead -     Comprehensive metabolic panel -     Lipase -     Amylase -     promethazine (PHENERGAN) injection 25 mg; Inject 1 mL (25 mg total) into the muscle once.  Other orders -     promethazine (PHENERGAN) 25 MG tablet; Take 1 tablet (25 mg total) by mouth every  6 (six) hours as needed for nausea or vomiting.   I am having Ms. Paganelli start on promethazine. I am also having her maintain her cetirizine, estradiol, Red Yeast Rice, fluticasone, montelukast, CALCIUM PO, POTASSIUM PO, Nutritional Supplements (LITHATE PO), 5-Hydroxytryptophan (5-HTP PO), MAGNESIUM PO, Cyanocobalamin (VITAMIN B-12 PO), Nutritional Supplements (DHEA PO), Probiotic Product (PROBIOTIC DAILY PO), niacin, Coenzyme Q10 (CO Q 10 PO), NON FORMULARY, aminocaproic acid, and temazepam. We administered promethazine and promethazine.  Meds ordered this encounter  Medications  . promethazine (PHENERGAN) injection 50 mg    Sig:   . promethazine (PHENERGAN) injection 25 mg    Sig:   . promethazine (PHENERGAN) 25 MG tablet    Sig: Take 1 tablet (25 mg total) by mouth every 6 (six) hours as needed for nausea or vomiting.    Dispense:  30 tablet    Refill:  0   She was hydrated with 2 L of IV fluid and given 50 mg and 25 more milligrams of Phenergan IM following 4 mg Zofran intravenously. She finally said that she was no longer nauseated I spoke with Dr. Linna Darner but the patient he agreed that we send her home with his medication if she can tolerate liquids or has increasing symptoms she should go to the  emergency room she certainly doesn't appear to have a surgical abdomen condition requiring admission currently  Appropriate red flag conditions were discussed with the patient as well as actions that should be taken.  Patient expressed his understanding.  Follow-up: Return if symptoms worsen or fail to improve.  Roselee Culver, MD   Results for orders placed or performed in visit on 08/04/15  POCT CBC  Result Value Ref Range   WBC 5.8 4.6 - 10.2 K/uL   Lymph, poc 2.1 0.6 - 3.4   POC LYMPH PERCENT 34.5 10 - 50 %L   MID (cbc) 0.3 0 - 0.9   POC MID % 4.4 0 - 12 %M   POC Granulocyte 3.5 2 - 6.9   Granulocyte percent 60.1 37 - 80 %G   RBC 4.73 4.04 - 5.48 M/uL   Hemoglobin 12.6 12.2 - 16.2 g/dL   HCT, POC 41.2 37.7 - 47.9 %   MCV 87.2 80 - 97 fL   MCH, POC 26.6 (A) 27 - 31.2 pg   MCHC 30.5 (A) 31.8 - 35.4 g/dL   RDW, POC 13.5 %   Platelet Count, POC 241 142 - 424 K/uL   MPV 7.2 0 - 99.8 fL  POCT glucose (manual entry)  Result Value Ref Range   POC Glucose 95 70 - 99 mg/dl

## 2016-06-17 DIAGNOSIS — F4322 Adjustment disorder with anxiety: Secondary | ICD-10-CM | POA: Diagnosis not present

## 2016-07-02 DIAGNOSIS — F4322 Adjustment disorder with anxiety: Secondary | ICD-10-CM | POA: Diagnosis not present

## 2016-07-22 DIAGNOSIS — F4320 Adjustment disorder, unspecified: Secondary | ICD-10-CM | POA: Diagnosis not present

## 2016-07-22 DIAGNOSIS — E6609 Other obesity due to excess calories: Secondary | ICD-10-CM | POA: Diagnosis not present

## 2016-07-22 DIAGNOSIS — Z0000 Encounter for general adult medical examination without abnormal findings: Secondary | ICD-10-CM | POA: Diagnosis not present

## 2016-07-22 DIAGNOSIS — Z1211 Encounter for screening for malignant neoplasm of colon: Secondary | ICD-10-CM | POA: Diagnosis not present

## 2016-07-22 DIAGNOSIS — Z6829 Body mass index (BMI) 29.0-29.9, adult: Secondary | ICD-10-CM | POA: Diagnosis not present

## 2016-07-22 DIAGNOSIS — E782 Mixed hyperlipidemia: Secondary | ICD-10-CM | POA: Diagnosis not present

## 2016-07-22 DIAGNOSIS — F4322 Adjustment disorder with anxiety: Secondary | ICD-10-CM | POA: Diagnosis not present

## 2016-07-22 DIAGNOSIS — K589 Irritable bowel syndrome without diarrhea: Secondary | ICD-10-CM | POA: Diagnosis not present

## 2016-07-27 DIAGNOSIS — E782 Mixed hyperlipidemia: Secondary | ICD-10-CM | POA: Diagnosis not present

## 2016-07-27 DIAGNOSIS — K589 Irritable bowel syndrome without diarrhea: Secondary | ICD-10-CM | POA: Diagnosis not present

## 2016-07-27 DIAGNOSIS — F4320 Adjustment disorder, unspecified: Secondary | ICD-10-CM | POA: Diagnosis not present

## 2016-07-27 DIAGNOSIS — E6609 Other obesity due to excess calories: Secondary | ICD-10-CM | POA: Diagnosis not present

## 2016-07-27 DIAGNOSIS — Z6829 Body mass index (BMI) 29.0-29.9, adult: Secondary | ICD-10-CM | POA: Diagnosis not present

## 2016-08-10 DIAGNOSIS — F4322 Adjustment disorder with anxiety: Secondary | ICD-10-CM | POA: Diagnosis not present

## 2016-08-24 DIAGNOSIS — F4322 Adjustment disorder with anxiety: Secondary | ICD-10-CM | POA: Diagnosis not present

## 2016-09-06 DIAGNOSIS — F4322 Adjustment disorder with anxiety: Secondary | ICD-10-CM | POA: Diagnosis not present

## 2016-09-22 DIAGNOSIS — F4322 Adjustment disorder with anxiety: Secondary | ICD-10-CM | POA: Diagnosis not present

## 2016-10-07 DIAGNOSIS — L821 Other seborrheic keratosis: Secondary | ICD-10-CM | POA: Diagnosis not present

## 2016-10-07 DIAGNOSIS — D2261 Melanocytic nevi of right upper limb, including shoulder: Secondary | ICD-10-CM | POA: Diagnosis not present

## 2016-10-07 DIAGNOSIS — D2271 Melanocytic nevi of right lower limb, including hip: Secondary | ICD-10-CM | POA: Diagnosis not present

## 2016-10-07 DIAGNOSIS — D225 Melanocytic nevi of trunk: Secondary | ICD-10-CM | POA: Diagnosis not present

## 2016-10-07 DIAGNOSIS — D485 Neoplasm of uncertain behavior of skin: Secondary | ICD-10-CM | POA: Diagnosis not present

## 2016-10-07 DIAGNOSIS — D2272 Melanocytic nevi of left lower limb, including hip: Secondary | ICD-10-CM | POA: Diagnosis not present

## 2016-10-07 DIAGNOSIS — D2262 Melanocytic nevi of left upper limb, including shoulder: Secondary | ICD-10-CM | POA: Diagnosis not present

## 2016-10-20 DIAGNOSIS — F4322 Adjustment disorder with anxiety: Secondary | ICD-10-CM | POA: Diagnosis not present

## 2016-11-03 DIAGNOSIS — F4322 Adjustment disorder with anxiety: Secondary | ICD-10-CM | POA: Diagnosis not present

## 2016-11-23 DIAGNOSIS — F4322 Adjustment disorder with anxiety: Secondary | ICD-10-CM | POA: Diagnosis not present

## 2018-03-27 ENCOUNTER — Encounter: Payer: Self-pay | Admitting: Physician Assistant
